# Patient Record
Sex: Male | Born: 1980 | Race: White | Hispanic: No | Marital: Single | Smoking: Never smoker
Health system: Southern US, Community
[De-identification: ages and names within clinical notes are randomized; demographics above are authoritative.]

## PROBLEM LIST (undated history)

## (undated) DIAGNOSIS — G8929 Other chronic pain: Secondary | ICD-10-CM

## (undated) DIAGNOSIS — R569 Unspecified convulsions: Secondary | ICD-10-CM

## (undated) DIAGNOSIS — M549 Dorsalgia, unspecified: Secondary | ICD-10-CM

---
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
## 2002-05-21 ENCOUNTER — Encounter: Payer: Self-pay | Admitting: Emergency Medicine

## 2002-05-21 ENCOUNTER — Emergency Department (HOSPITAL_COMMUNITY): Admission: EM | Admit: 2002-05-21 | Discharge: 2002-05-21 | Payer: Self-pay | Admitting: Emergency Medicine

## 2004-04-30 ENCOUNTER — Other Ambulatory Visit: Payer: Self-pay

## 2004-05-25 ENCOUNTER — Other Ambulatory Visit: Payer: Self-pay

## 2006-03-30 ENCOUNTER — Emergency Department: Payer: Self-pay | Admitting: Emergency Medicine

## 2007-01-16 ENCOUNTER — Emergency Department: Payer: Self-pay | Admitting: Unknown Physician Specialty

## 2007-07-05 ENCOUNTER — Other Ambulatory Visit: Payer: Self-pay

## 2007-07-05 ENCOUNTER — Emergency Department: Payer: Self-pay | Admitting: Emergency Medicine

## 2008-02-16 ENCOUNTER — Emergency Department: Payer: Self-pay | Admitting: Emergency Medicine

## 2008-06-29 ENCOUNTER — Emergency Department (HOSPITAL_COMMUNITY): Admission: EM | Admit: 2008-06-29 | Discharge: 2008-06-29 | Payer: Self-pay | Admitting: Emergency Medicine

## 2008-11-02 ENCOUNTER — Emergency Department: Payer: Self-pay | Admitting: Internal Medicine

## 2008-11-05 ENCOUNTER — Inpatient Hospital Stay (HOSPITAL_COMMUNITY): Admission: EM | Admit: 2008-11-05 | Discharge: 2008-11-10 | Payer: Self-pay | Admitting: Emergency Medicine

## 2008-11-20 ENCOUNTER — Emergency Department (HOSPITAL_COMMUNITY): Admission: EM | Admit: 2008-11-20 | Discharge: 2008-11-20 | Payer: Self-pay | Admitting: Emergency Medicine

## 2008-11-30 ENCOUNTER — Emergency Department: Payer: Self-pay | Admitting: Internal Medicine

## 2009-01-04 ENCOUNTER — Emergency Department: Payer: Self-pay | Admitting: Emergency Medicine

## 2009-01-08 ENCOUNTER — Inpatient Hospital Stay: Payer: Self-pay | Admitting: Internal Medicine

## 2009-02-16 IMAGING — CT CT HEAD WITHOUT CONTRAST
2 series · 16 of 30 positions shown, 20 images · non-contrast
Comparison: none

REASON FOR EXAM: post fall
COMMENTS:

[Series 2: without · axial · non-contrast · 0.41mm/px · z∈[-80,+56]mm · 13 of 33 slices shown, 17 images]
[im 3/33  brain]
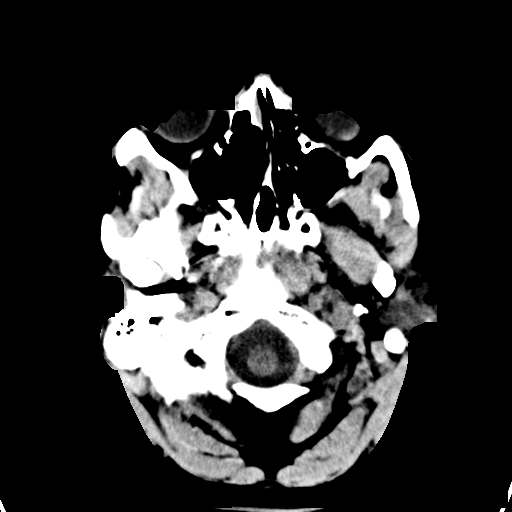
[im 3/33  bone]
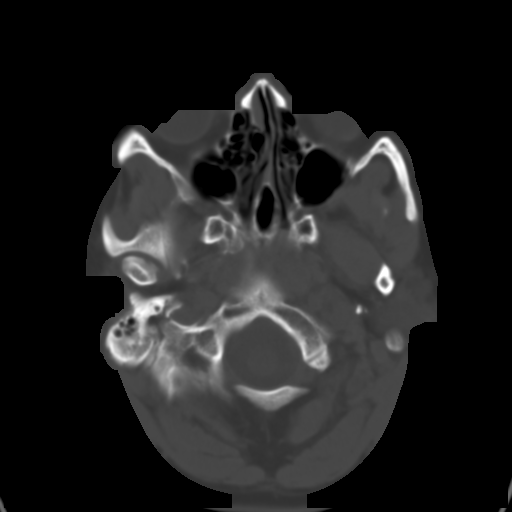
[im 5/33  brain]
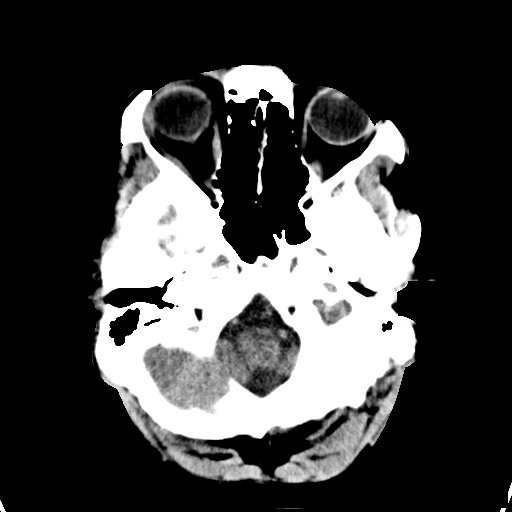
[im 7/33  brain]
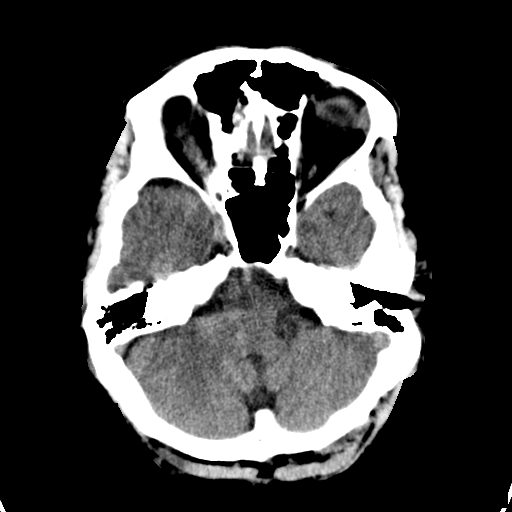
[im 10/33  brain]
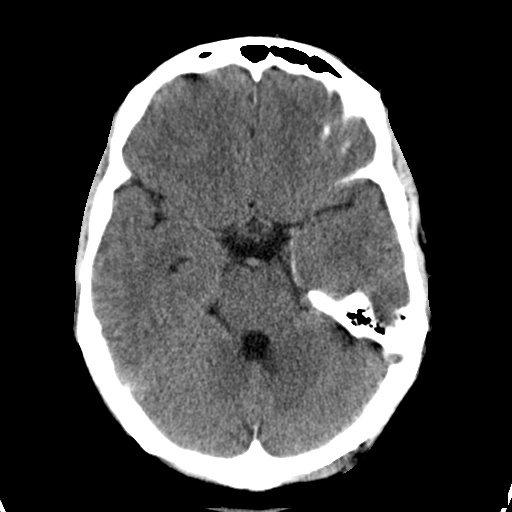
[im 12/33  brain]
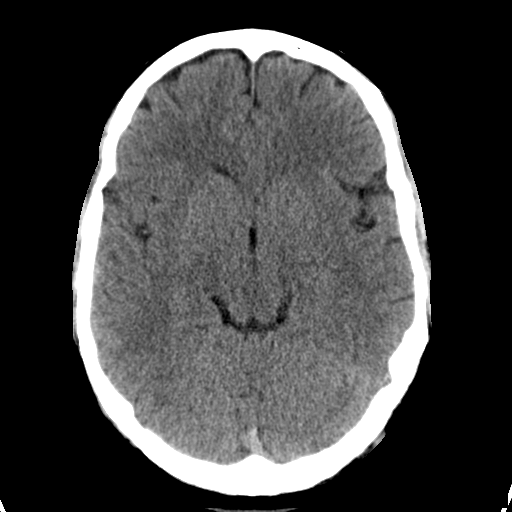
[im 12/33  bone]
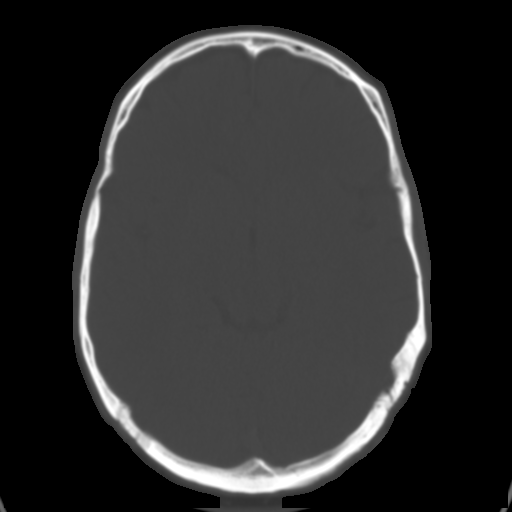
[im 14/33  brain]
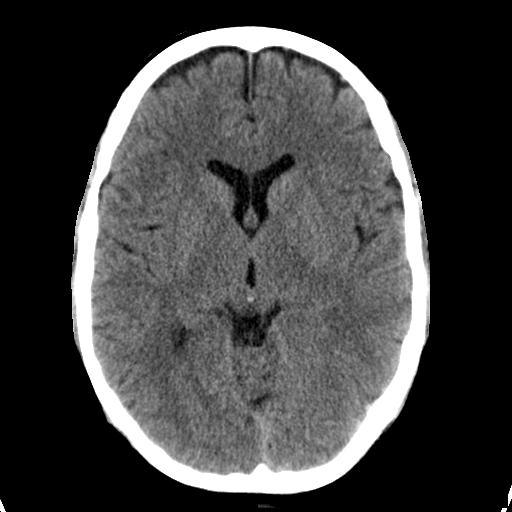
[im 17/33  brain]
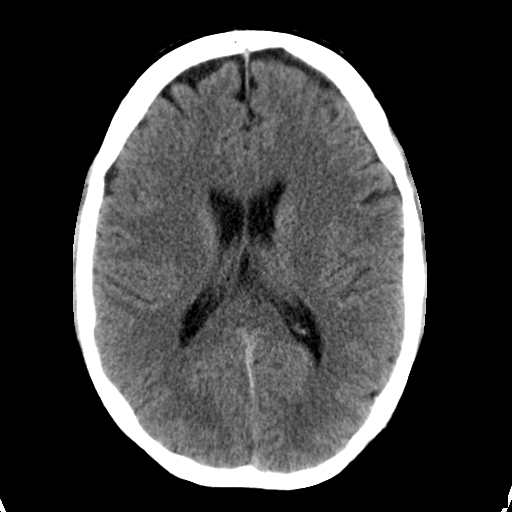
[im 19/33  brain]
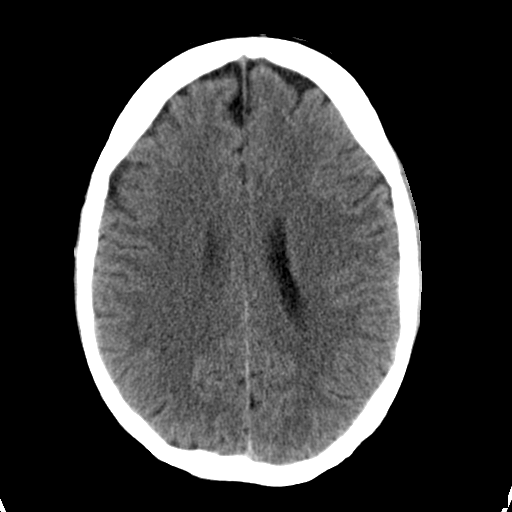
[im 21/33  brain]
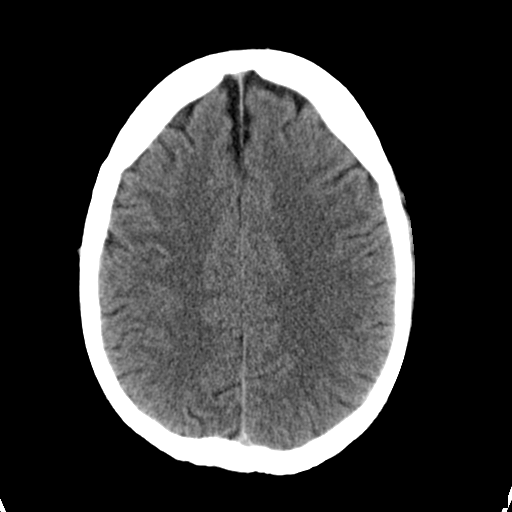
[im 21/33  bone]
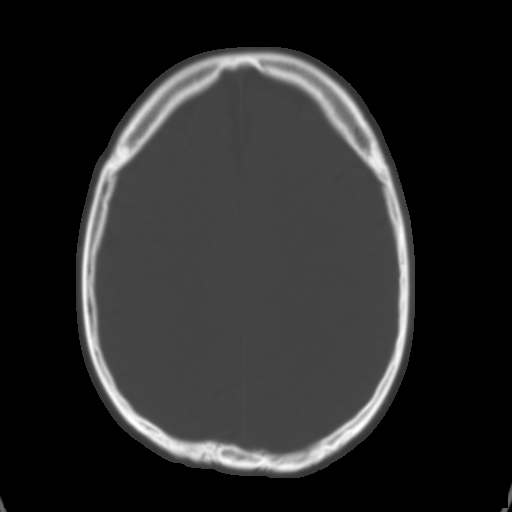
[im 23/33  brain]
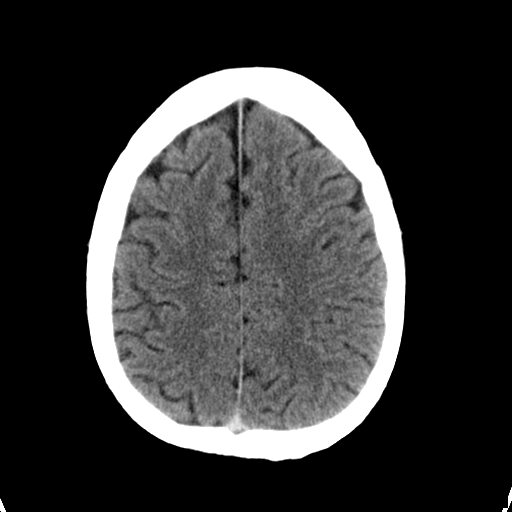
[im 26/33  brain]
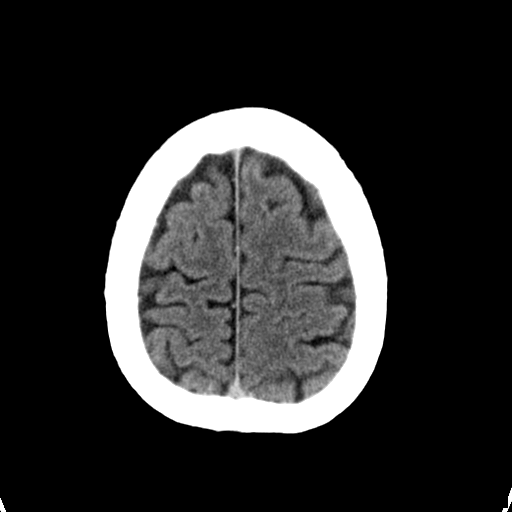
[im 28/33  brain]
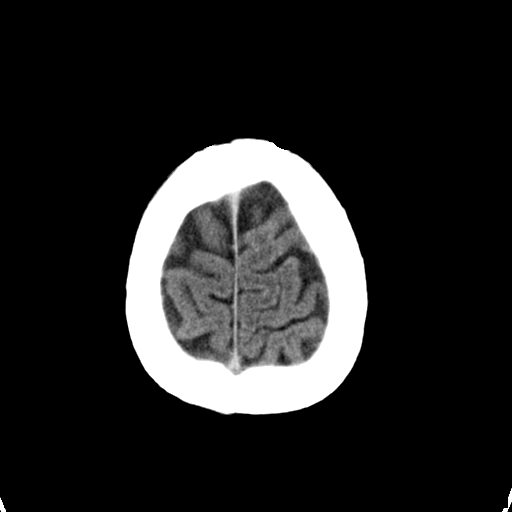
[im 30/33  brain]
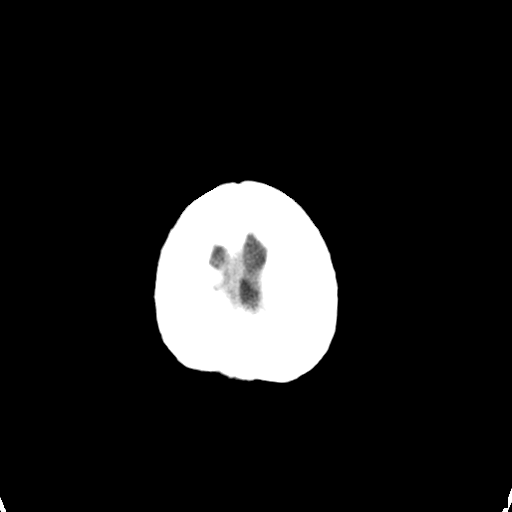
[im 30/33  bone]
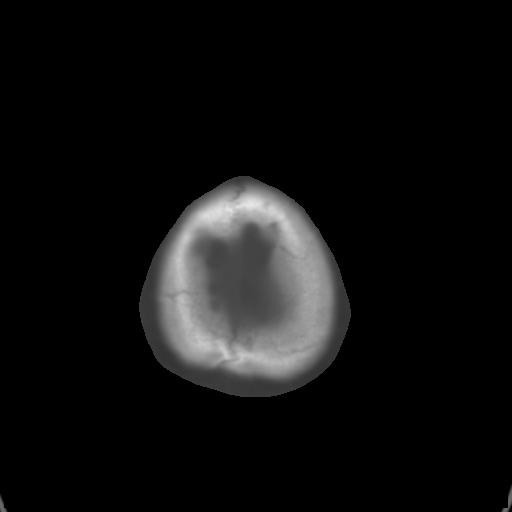

[Series 3: bone · axial · 0.41mm/px · z∈[-80,-34]mm · 3 of 33 slices shown]
[im 3/33  bone]
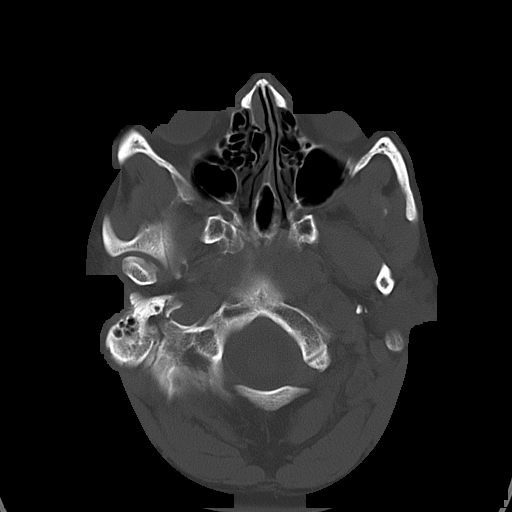
[im 7/33  bone]
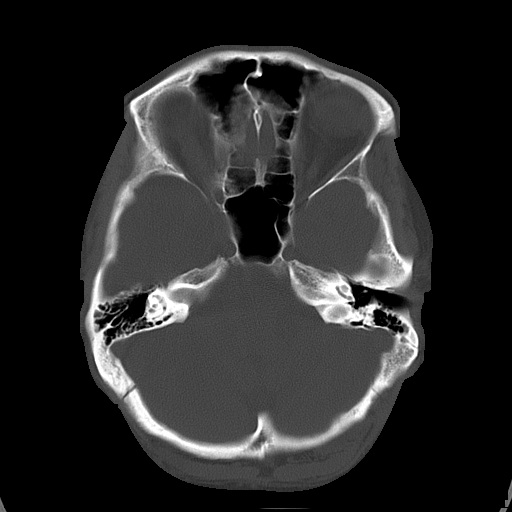
[im 12/33  bone]
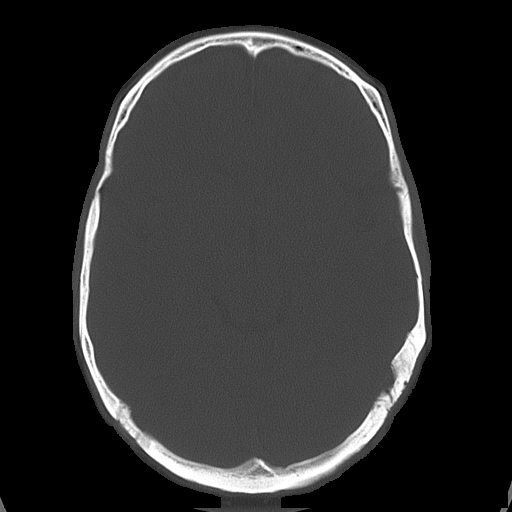

[16 of 30 positions shown; findings below may reference images not displayed]

PROCEDURE:     CT  - CT HEAD WITHOUT CONTRAST  - January 09, 2009  [DATE]

RESULT:     Non-contrast CT of the brain is compared to previous
examinations dated 01/07/2009 and 11/02/2008.

The ventricles and sulci are within normal limits for the patient's age.
There is no territorial infarct. There is no parenchymal or extra-axial
hemorrhage. There is no mass-effect or midline shift. There is no underlying
mass evident. The included images through the paranasal sinuses and mastoid
air cells demonstrate normal aeration. The calvarium is intact.
IMPRESSION: 1.     Stable CT of the brain with age appropriate atrophy. No acute
intracranial abnormality evident.
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
## 2011-05-13 NOTE — Consult Note (Signed)
NAME:  Ian Bowers, Ian Bowers            ACCOUNT NO.:  [id number]   MEDICAL RECORD NO.:  [id number]          PATIENT TYPE:  EMS   LOCATION:  MAJO                         FACILITY:  [facility]   PHYSICIAN:  Felicie Morn, PA-C      DATE OF BIRTH:  1981/07/11   DATE OF CONSULTATION:  11/20/2008  DATE OF DISCHARGE:                                 CONSULTATION   ADDENDUM   RECOMMENDATION:  Dilantin 300 mg q.h.s.   The patient was given a script by Dr. Anne Hahn for Dilantin 100 mg, take 3  tabs q.h.s., dispense #120.           ______________________________  Felicie Morn, PA-C     DS/MEDQ  D:  11/20/2008  T:  11/20/2008  Job:  [id number]
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
## 2011-05-13 NOTE — Discharge Summary (Signed)
Ian Bowers, Ian Bowers            ACCOUNT NO.:  [id number]   MEDICAL RECORD NO.:  [id number]          PATIENT TYPE:  INP   LOCATION:  3015                         FACILITY:  [facility]   PHYSICIAN:  Altha Harm, MDDATE OF BIRTH:  [birth year]   DATE OF ADMISSION:  11/05/2008  DATE OF DISCHARGE:  11/10/2008                               DISCHARGE SUMMARY   DISCHARGE DISPOSITION:  Home.   DISCHARGE DIAGNOSES:  1. Seizures versus pseudoseizures.  2. Pain in lower extremities, etiology unknown.  3. Gait disturbance.  4. Rule out somatoform disorder.   DISCHARGE MEDICATIONS:  1. Keppra 1500 mg p.o. b.i.d.  2. Oxycodone 5 mg p.o. q.4 h p.r.n.   CONSULTANTS:  1. Catherine A. Orlin Hilding, MD, neurology.  2. Antonietta Breach, MD, psychiatry.   PROCEDURES:  None.   DIAGNOSTIC STUDIES:  1. CT head without contrast done at admission on November 8 which is      negative noncontrast head CT.  2. Portable chest x-ray done at admission on November 8 which shows an      unremarkable examination.  3. CT of the cervical spine without contrast which shows no focal or      acute osseous lesions.  Spinal cord appears patent throughout.      Dependent atelectasis in the lungs bilaterally.  4. CT of the thoracic spine without contrast which shows no focal or      acute osseous lesions.  Spinal canal appears patent throughout.  5. X-ray of the lumbar spine which shows mild straightening of the      lumbar lordosis otherwise normal examination.  6. MR of the brain with and without contrast which shows no      significant abnormality.  7. MR of the C-spine with and without contrast which shows no      significant abnormality.  8. MR of the T-spine with and without contrast which shows no      significant abnormality.  9. EEG done on November 9 which shows an equivocal study.   ALLERGIES:  No known drug allergies.   CODE STATUS:  Full code.   PRIMARY CARE PHYSICIAN:  Unassigned at this time.   CHIEF COMPLAINT:  Recurrent seizures and blackouts.   HISTORY OF PRESENT ILLNESS:  Please refer to the H and P by Dr. Brien Few for  details of the HPI.   HOSPITAL COURSE:  1. Seizure activity.  The patient reported seizures prior to coming to      the hospital.  The patient had several episodes of seizure like      activity.  However, on most occasions this was unobserved by any      hospital staff.  It was observed, however, on two occasions by the      nursing staff.  Neurology was consulted and EEG was done which was      equivocal and they are unsure as to whether or not this is true      seizure activity versus pseudoseizure activity.  The patient had      his last seizure here in the hospital approximately 48 hours ago  and the Keppra was increased from 1000 to 1500 mg.  The patient has      been advised by neurology to follow up as an outpatient and if the      seizures persist he will likely need 24 hour video EEG for further      evaluation.  2. Gait disturbance and inability to ambulate.  The patient describes      an usual sensation of pain which radiates from his legs all the way      up to his brain when he has any weightbearing.  The patient was      evaluated by neurology who felt that this pattern of pain did not      follow any known disease entity and they were unable to find any      physical findings to correlate with this symptomatology.  Physical      therapy has attempted to work with the patient and the patient had      been unable to bear weight throughout his legs.  ESR was done to      evaluate for any information and was found to be normal at 8.   The patient is being discharged home.  Currently he is using a  wheelchair and home care physical therapy and occupational therapy have  been ordered for the patient.  The patient has been taking oxycodone for  pain and will continue on oxycodone as an outpatient and follow up with  his primary care physician for  further pain management.  The patient has  been advised to follow up as an outpatient with [facility], where further testing if warranted will be done as an  outpatient.  Otherwise, the patient has no other medical problems and  has been stable from other perspectives.  The patient did have some  vomiting on arrival to the hospital, however, that has resolved and the  patient has been tolerating diet without any difficulty.   FOLLOWUP:  The patient is to follow up with primary care physician of  his choice.  The patient was formally being seen by Dr. Lacie Scotts at  [facility].  However, he states that he has chosen to  leave the practice and find a new physician.   DIETARY RESTRICTIONS:  Are none.   PHYSICAL RESTRICTIONS:  Activity as tolerated and directed by physical  therapy.      Altha Harm, MD  Electronically Signed     MAM/MEDQ  D:  11/10/2008  T:  11/10/2008  Job:  [id number]
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
## 2011-08-11 ENCOUNTER — Emergency Department: Payer: Self-pay | Admitting: Unknown Physician Specialty

## 2011-09-25 LAB — POCT I-STAT, CHEM 8
BUN: 16
Calcium, Ion: 1.07 — ABNORMAL LOW
Creatinine, Ser: 1
Glucose, Bld: 119 — ABNORMAL HIGH
Hemoglobin: 17.3 — ABNORMAL HIGH

## 2011-09-25 LAB — URINALYSIS, ROUTINE W REFLEX MICROSCOPIC
Bilirubin Urine: NEGATIVE
Glucose, UA: NEGATIVE
Hgb urine dipstick: NEGATIVE
Ketones, ur: 15 — AB
Nitrite: NEGATIVE
Protein, ur: NEGATIVE
Specific Gravity, Urine: 1.007

## 2011-09-25 LAB — CBC
HCT: 46.9
Hemoglobin: 16.1
MCHC: 34.3
MCV: 81.8
Platelets: 328
RBC: 5.74
RDW: 12.6
WBC: 8.9

## 2011-09-25 LAB — DIFFERENTIAL
Basophils Absolute: 0.1
Basophils Relative: 1
Eosinophils Relative: 1
Neutrophils Relative %: 63

## 2011-09-25 LAB — HEPATIC FUNCTION PANEL
Albumin: 5.1
Bilirubin, Direct: 0.2

## 2011-09-25 LAB — LIPASE, BLOOD: Lipase: 19

## 2011-09-30 LAB — POCT I-STAT, CHEM 8
Chloride: 102
Chloride: 103
HCT: 45
Hemoglobin: 15.3
Hemoglobin: 17.7 — ABNORMAL HIGH
Potassium: 3.9
Potassium: 3.9
Sodium: 140
TCO2: 24

## 2011-09-30 LAB — BASIC METABOLIC PANEL
BUN: 10
BUN: 6
CO2: 27
CO2: 31
Calcium: 8.7
Calcium: 9.2
Calcium: 9.7
Chloride: 103
Creatinine, Ser: 0.82
GFR calc Af Amer: >60
GFR calc Af Amer: >60
GFR calc Af Amer: >60
GFR calc non Af Amer: >60
GFR calc non Af Amer: >60
GFR calc non Af Amer: >60
GFR calc non Af Amer: >60
Glucose, Bld: 101 — ABNORMAL HIGH
Glucose, Bld: 88
Potassium: 3.2 — ABNORMAL LOW
Potassium: 3.8
Potassium: 4
Sodium: 137
Sodium: 139
Sodium: 140

## 2011-09-30 LAB — CBC
HCT: 44.2
Hemoglobin: 13
MCV: 82.2
Platelets: 299
RBC: 4.72
RDW: 12.6
RDW: 12.9
WBC: 6.8
WBC: 9.5

## 2011-09-30 LAB — DIFFERENTIAL
Basophils Absolute: 0.1
Eosinophils Relative: 2
Lymphocytes Relative: 36
Lymphs Abs: 2.5
Neutro Abs: 3.7
Neutrophils Relative %: 54

## 2011-09-30 LAB — PROLACTIN: Prolactin: 11.8 (ref 2.1–17.1)

## 2011-09-30 LAB — PROTIME-INR
INR: 1
Prothrombin Time: 13.4

## 2011-09-30 LAB — RPR: RPR Ser Ql: NONREACTIVE

## 2011-09-30 LAB — CK: Total CK: 87

## 2011-09-30 LAB — RAPID URINE DRUG SCREEN, HOSP PERFORMED
Benzodiazepines: POSITIVE — AB
Cocaine: NOT DETECTED
Opiates: NOT DETECTED

## 2011-09-30 LAB — HEMOGLOBIN A1C
Hgb A1c MFr Bld: 5.4
Mean Plasma Glucose: 108

## 2011-09-30 LAB — PHOSPHORUS: Phosphorus: 5 — ABNORMAL HIGH

## 2012-06-25 ENCOUNTER — Emergency Department: Payer: Self-pay | Admitting: Emergency Medicine

## 2012-08-26 ENCOUNTER — Emergency Department: Payer: Self-pay | Admitting: *Deleted

## 2012-08-26 LAB — URINALYSIS, COMPLETE
Bacteria: NONE SEEN
Hyaline Cast: 2
Nitrite: NEGATIVE
Ph: 5 (ref 4.5–8.0)
Protein: 100

## 2012-08-26 LAB — CBC WITH DIFFERENTIAL/PLATELET
Basophil %: 0.8 %
Eosinophil #: 0.2 10*3/uL (ref 0.0–0.7)
Eosinophil %: 1.5 %
HCT: 44.8 % (ref 40.0–52.0)
HGB: 14.8 g/dL (ref 13.0–18.0)
Lymphocyte #: 1.4 10*3/uL (ref 1.0–3.6)
MCH: 26.9 pg (ref 26.0–34.0)
MCHC: 33 g/dL (ref 32.0–36.0)
Monocyte #: 0.7 x10 3/mm (ref 0.2–1.0)
Neutrophil %: 85 %
Platelet: 304 10*3/uL (ref 150–440)

## 2012-08-26 LAB — BASIC METABOLIC PANEL
Creatinine: 1.15 mg/dL (ref 0.60–1.30)
EGFR (African American): >60
EGFR (Non-African Amer.): >60
Glucose: 141 mg/dL — ABNORMAL HIGH (ref 65–99)
Sodium: 138 mmol/L (ref 136–145)

## 2012-12-16 ENCOUNTER — Emergency Department: Payer: Self-pay | Admitting: Emergency Medicine

## 2012-12-16 LAB — CBC
MCH: 26.7 pg (ref 26.0–34.0)
MCHC: 32.7 g/dL (ref 32.0–36.0)
MCV: 82 fL (ref 80–100)
Platelet: 278 10*3/uL (ref 150–440)

## 2012-12-16 LAB — TROPONIN I: Troponin-I: <0.02 ng/mL

## 2012-12-16 LAB — COMPREHENSIVE METABOLIC PANEL
Albumin: 4.3 g/dL (ref 3.4–5.0)
Anion Gap: 8 (ref 7–16)
BUN: 9 mg/dL (ref 7–18)
Calcium, Total: 8.4 mg/dL — ABNORMAL LOW (ref 8.5–10.1)
EGFR (African American): >60
Glucose: 75 mg/dL (ref 65–99)
Potassium: 3.1 mmol/L — ABNORMAL LOW (ref 3.5–5.1)
SGOT(AST): 24 U/L (ref 15–37)
SGPT (ALT): 28 U/L (ref 12–78)
Total Protein: 7.9 g/dL (ref 6.4–8.2)

## 2013-01-31 ENCOUNTER — Inpatient Hospital Stay: Payer: Self-pay | Admitting: Internal Medicine

## 2013-01-31 LAB — HEPATIC FUNCTION PANEL A (ARMC)
Alkaline Phosphatase: 92 U/L (ref 50–136)
SGOT(AST): 31 U/L (ref 15–37)
SGPT (ALT): 57 U/L (ref 12–78)

## 2013-01-31 LAB — DRUG SCREEN, URINE
Barbiturates, Ur Screen: NEGATIVE (ref ?–200)
Benzodiazepine, Ur Scrn: NEGATIVE (ref ?–200)
Cannabinoid 50 Ng, Ur ~~LOC~~: POSITIVE (ref ?–50)
Cocaine Metabolite,Ur ~~LOC~~: NEGATIVE (ref ?–300)
MDMA (Ecstasy)Ur Screen: NEGATIVE (ref ?–500)
Methadone, Ur Screen: NEGATIVE (ref ?–300)
Opiate, Ur Screen: POSITIVE (ref ?–300)
Phencyclidine (PCP) Ur S: NEGATIVE (ref ?–25)

## 2013-01-31 LAB — URINALYSIS, COMPLETE
Bilirubin,UR: NEGATIVE
Granular Cast: 12
Nitrite: NEGATIVE
Protein: NEGATIVE
WBC UR: 3 /HPF (ref 0–5)

## 2013-01-31 LAB — BASIC METABOLIC PANEL
Anion Gap: 11 (ref 7–16)
Calcium, Total: 9.8 mg/dL (ref 8.5–10.1)
Co2: 24 mmol/L (ref 21–32)
Creatinine: 1.06 mg/dL (ref 0.60–1.30)
EGFR (African American): >60
Glucose: 95 mg/dL (ref 65–99)
Potassium: 3.5 mmol/L (ref 3.5–5.1)

## 2013-01-31 LAB — CBC WITH DIFFERENTIAL/PLATELET
Eosinophil #: 0.2 10*3/uL (ref 0.0–0.7)
Eosinophil %: 1.4 %
HCT: 46.1 % (ref 40.0–52.0)
Neutrophil %: 64.2 %
RBC: 5.61 10*6/uL (ref 4.40–5.90)
WBC: 14.1 10*3/uL — ABNORMAL HIGH (ref 3.8–10.6)

## 2013-01-31 LAB — ETHANOL
Ethanol %: <0.003 % (ref 0.000–0.080)
Ethanol: <3 mg/dL

## 2013-01-31 LAB — PHENYTOIN LEVEL, TOTAL: Dilantin: 0.7 ug/mL — ABNORMAL LOW (ref 10.0–20.0)

## 2013-02-01 ENCOUNTER — Ambulatory Visit: Payer: Self-pay | Admitting: Neurology

## 2013-02-01 LAB — CBC WITH DIFFERENTIAL/PLATELET
HCT: 41.4 % (ref 40.0–52.0)
HGB: 14 g/dL (ref 13.0–18.0)
Lymphocyte #: 3.6 10*3/uL (ref 1.0–3.6)
Lymphocyte %: 33.9 %
MCH: 27.7 pg (ref 26.0–34.0)
MCV: 82 fL (ref 80–100)
Neutrophil %: 56.2 %
Platelet: 281 10*3/uL (ref 150–440)
RBC: 5.05 10*6/uL (ref 4.40–5.90)

## 2013-02-04 ENCOUNTER — Emergency Department: Payer: Self-pay | Admitting: Emergency Medicine

## 2013-02-04 LAB — URINALYSIS, COMPLETE
Bacteria: NONE SEEN
Blood: NEGATIVE
Glucose,UR: NEGATIVE mg/dL (ref 0–75)
Ph: 6 (ref 4.5–8.0)
Specific Gravity: 1.031 (ref 1.003–1.030)
WBC UR: 4 /HPF (ref 0–5)

## 2013-02-04 LAB — COMPREHENSIVE METABOLIC PANEL
Alkaline Phosphatase: 87 U/L (ref 50–136)
Anion Gap: 20 — ABNORMAL HIGH (ref 7–16)
Calcium, Total: 9.6 mg/dL (ref 8.5–10.1)
Co2: 15 mmol/L — ABNORMAL LOW (ref 21–32)
Creatinine: 1.24 mg/dL (ref 0.60–1.30)
EGFR (Non-African Amer.): >60
Osmolality: 270 (ref 275–301)
Potassium: 3.3 mmol/L — ABNORMAL LOW (ref 3.5–5.1)
Sodium: 133 mmol/L — ABNORMAL LOW (ref 136–145)
Total Protein: 8.9 g/dL — ABNORMAL HIGH (ref 6.4–8.2)

## 2013-02-04 LAB — VALPROIC ACID LEVEL: Valproic Acid: 41 ug/mL — ABNORMAL LOW

## 2013-02-04 LAB — CBC
HGB: 15.5 g/dL (ref 13.0–18.0)
MCHC: 32.8 g/dL (ref 32.0–36.0)
RDW: 12.8 % (ref 11.5–14.5)
WBC: 14.2 10*3/uL — ABNORMAL HIGH (ref 3.8–10.6)

## 2013-02-07 ENCOUNTER — Emergency Department: Payer: Self-pay | Admitting: Emergency Medicine

## 2013-03-10 IMAGING — CT CT HEAD WITHOUT CONTRAST
1 series · 16 of 30 positions shown, 20 images · non-contrast
Comparison: none

REASON FOR EXAM: recurrent seizures
COMMENTS:

PROCEDURE:     CT  - CT HEAD WITHOUT CONTRAST  - January 31, 2013  [DATE]
RESULT:     Comparison:  06/25/2012
TECHNIQUE: Multiple axial images from the foramen magnum to the vertex were
obtained without IV contrast.

[Series 2: soft tissue · axial · 0.43mm/px · z∈[+230,+375]mm · 16 of 33 slices shown, 20 images]
[im 2/33  brain]
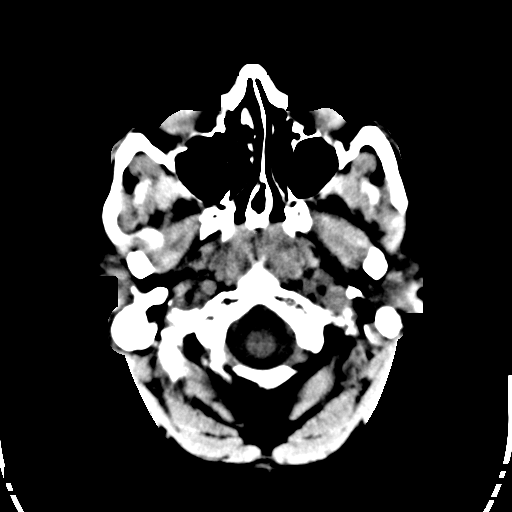
[im 2/33  bone]
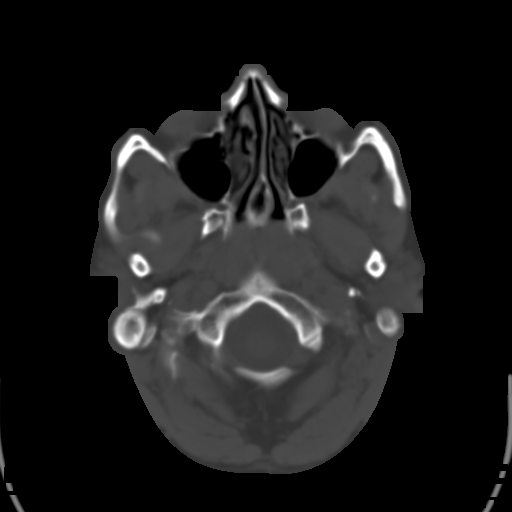
[im 4/33  brain]
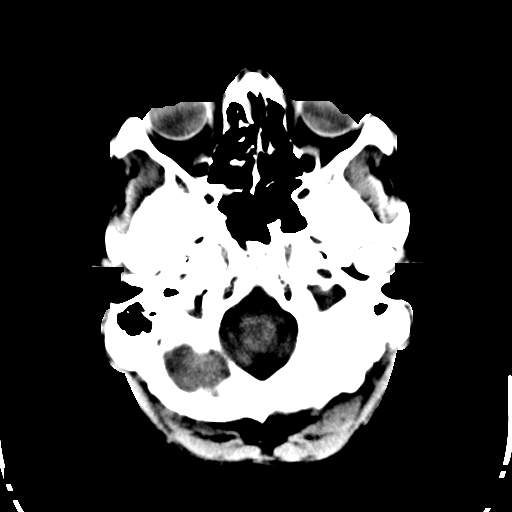
[im 6/33  brain]
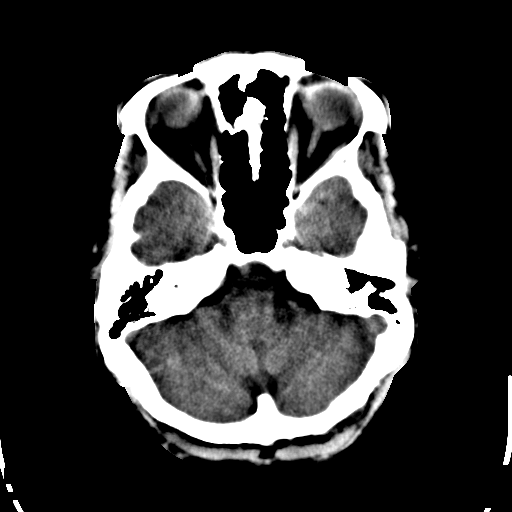
[im 8/33  brain]
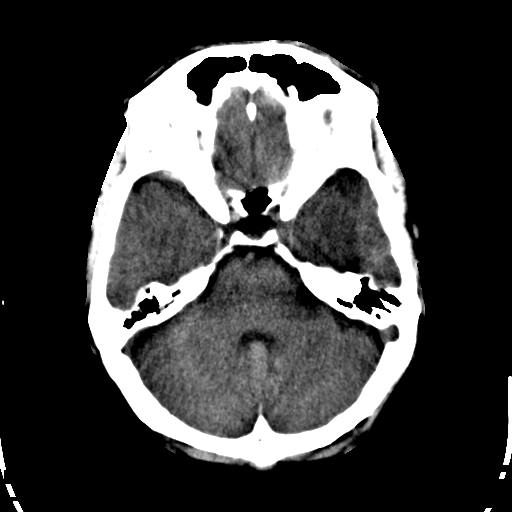
[im 9/33  brain]
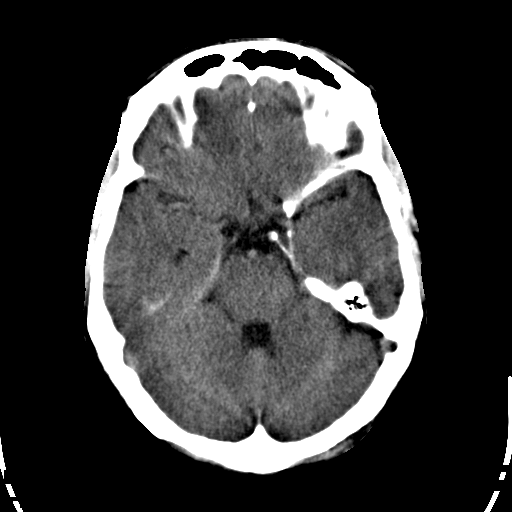
[im 9/33  bone]
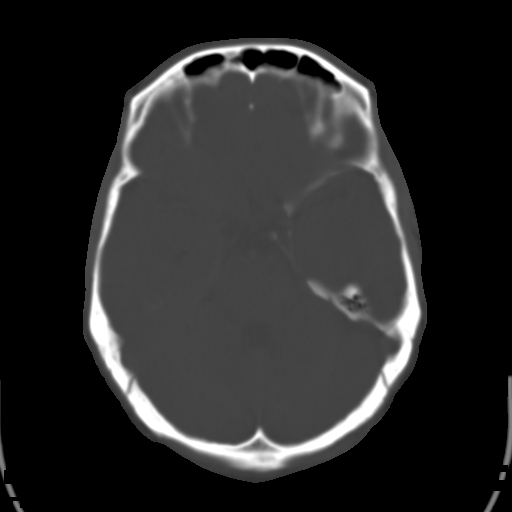
[im 12/33  brain]
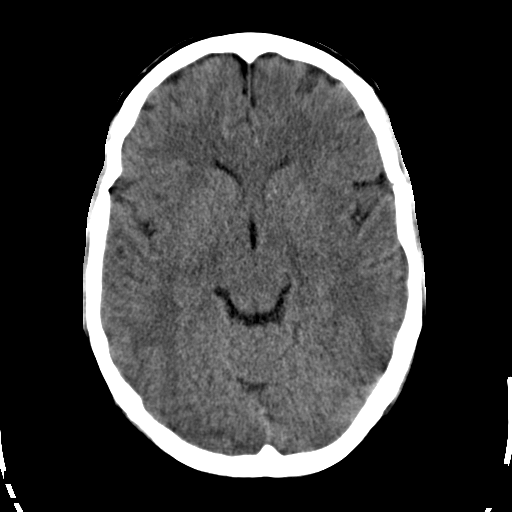
[im 14/33  brain]
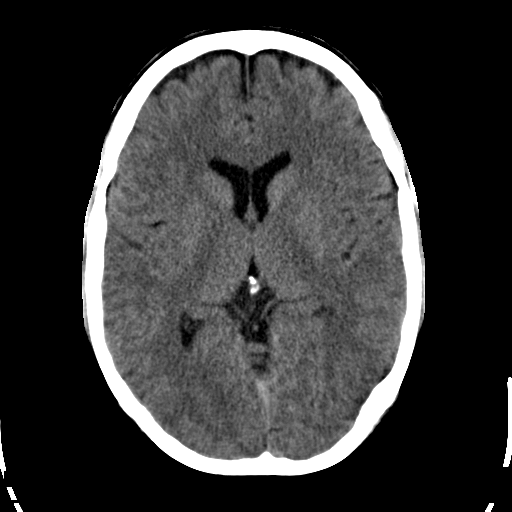
[im 16/33  brain]
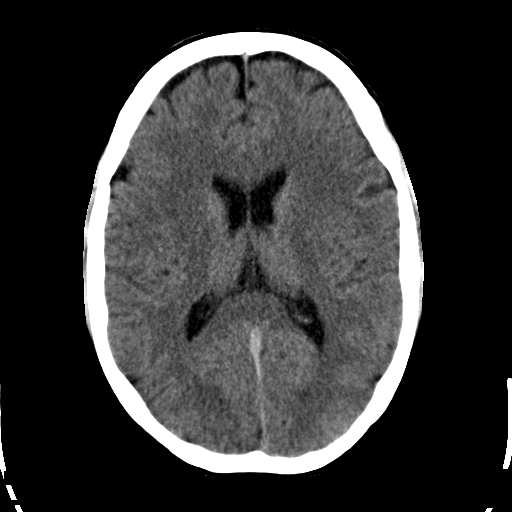
[im 17/33  brain]
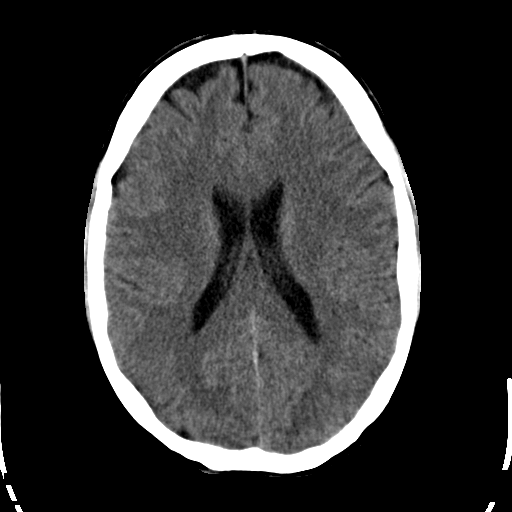
[im 17/33  bone]
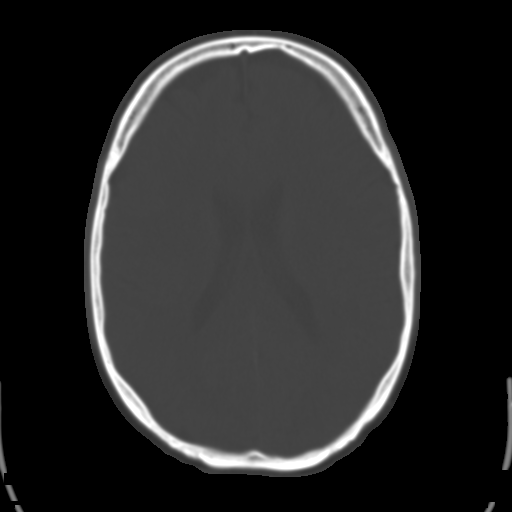
[im 19/33  brain]
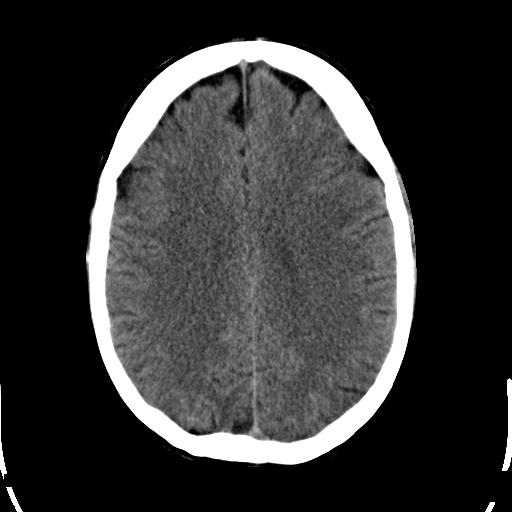
[im 21/33  brain]
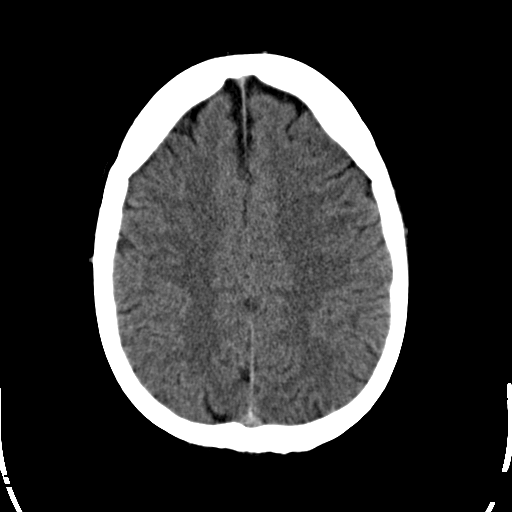
[im 24/33  brain]
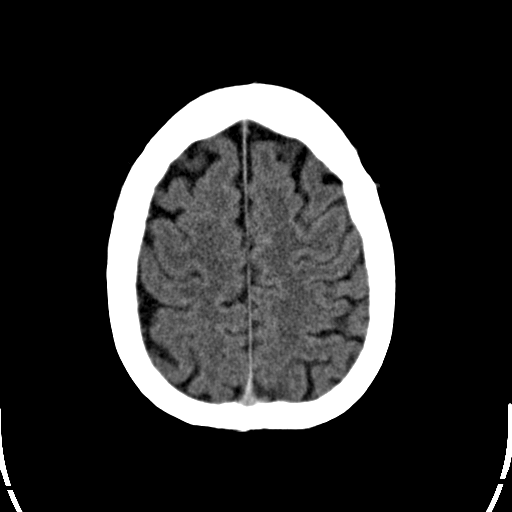
[im 25/33  brain]
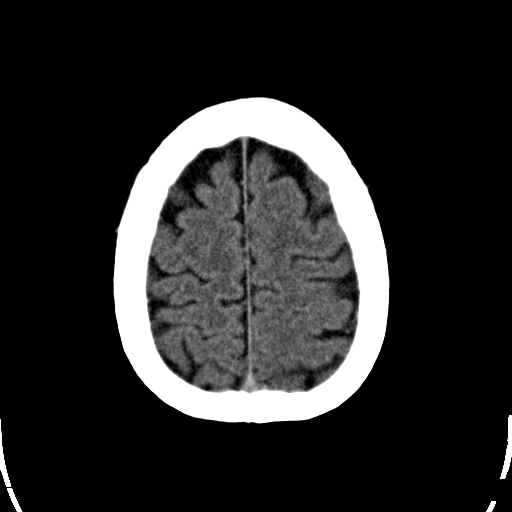
[im 25/33  bone]
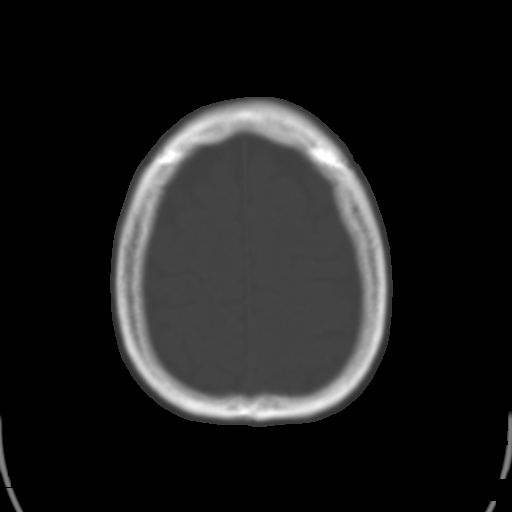
[im 27/33  brain]
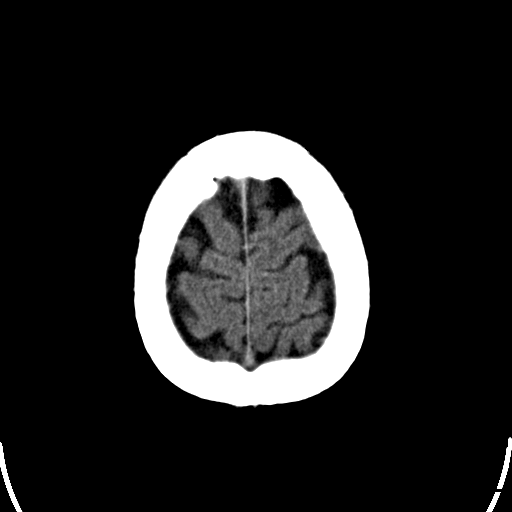
[im 29/33  brain]
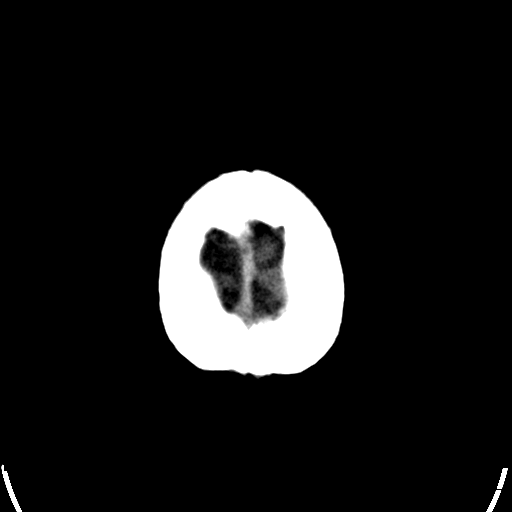
[im 31/33  brain]
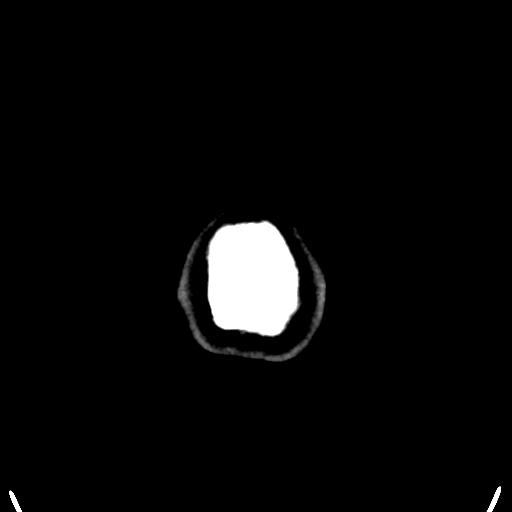

[16 of 30 positions shown; findings below may reference images not displayed]

FINDINGS: There is no evidence for mass effect, midline shift, or extra-axial fluid
collections. There is no evidence for space-occupying lesion, intracranial
hemorrhage, or cortical-based area of infarction.

The osseous structures are unremarkable.
IMPRESSION: No acute intracranial process.

[REDACTED]

## 2013-08-04 ENCOUNTER — Emergency Department: Payer: Self-pay | Admitting: Emergency Medicine

## 2014-12-27 ENCOUNTER — Emergency Department: Payer: Self-pay | Admitting: Emergency Medicine

## 2014-12-27 LAB — COMPREHENSIVE METABOLIC PANEL
ALBUMIN: 4.3 g/dL (ref 3.4–5.0)
ALK PHOS: 81 U/L
ALT: 76 U/L — AB
ANION GAP: 6 — AB (ref 7–16)
AST: 43 U/L — AB (ref 15–37)
BUN: 11 mg/dL (ref 7–18)
Bilirubin,Total: 0.6 mg/dL (ref 0.2–1.0)
CO2: 30 mmol/L (ref 21–32)
CREATININE: 0.8 mg/dL (ref 0.60–1.30)
Calcium, Total: 9.2 mg/dL (ref 8.5–10.1)
Chloride: 100 mmol/L (ref 98–107)
EGFR (African American): >60
GLUCOSE: 91 mg/dL (ref 65–99)
OSMOLALITY: 271 (ref 275–301)
Potassium: 3.6 mmol/L (ref 3.5–5.1)
SODIUM: 136 mmol/L (ref 136–145)
Total Protein: 8.5 g/dL — ABNORMAL HIGH (ref 6.4–8.2)

## 2014-12-27 LAB — SALICYLATE LEVEL

## 2014-12-27 LAB — URINALYSIS, COMPLETE
BACTERIA: NONE SEEN
BILIRUBIN, UR: NEGATIVE
BLOOD: NEGATIVE
GLUCOSE, UR: NEGATIVE mg/dL (ref 0–75)
KETONE: NEGATIVE
Leukocyte Esterase: NEGATIVE
Nitrite: NEGATIVE
Ph: 6 (ref 4.5–8.0)
Protein: NEGATIVE
RBC, UR: NONE SEEN /HPF (ref 0–5)
SQUAMOUS EPITHELIAL: NONE SEEN
Specific Gravity: 1.003 (ref 1.003–1.030)

## 2014-12-27 LAB — CBC
HCT: 44 % (ref 40.0–52.0)
HGB: 14.4 g/dL (ref 13.0–18.0)
MCH: 27.1 pg (ref 26.0–34.0)
MCHC: 32.8 g/dL (ref 32.0–36.0)
MCV: 83 fL (ref 80–100)
PLATELETS: 305 10*3/uL (ref 150–440)
RBC: 5.34 10*6/uL (ref 4.40–5.90)
RDW: 14.2 % (ref 11.5–14.5)
WBC: 7.9 10*3/uL (ref 3.8–10.6)

## 2014-12-27 LAB — DRUG SCREEN, URINE

## 2014-12-27 LAB — ETHANOL: Ethanol: <3 mg/dL

## 2014-12-27 LAB — ACETAMINOPHEN LEVEL: Acetaminophen: <2 ug/mL

## 2015-04-20 NOTE — Consult Note (Signed)
PATIENT NAME:  Ian Bowers, Ian Bowers MR#:  [id number] DATE OF BIRTH:  07/25/1981  DATE OF CONSULTATION:  02/01/2013  REFERRING PHYSICIAN:   CONSULTING PHYSICIAN:  Pauletta Browns, MD  REASON FOR CONSULTATION: Seizures.  HISTORY OF PRESENT ILLNESS: This is a 34 year old male who as per him reports a history of seizures when the patient was a child and being admitted for multiple generalized tonic-clonic seizure episodes. The patient states his seizures were in childhood. I am unsure if he was treated for them and then they subsided and then they restarted at the age of 33. Has been worked up as an outpatient and the patient was diagnosed as pseudoseizures. He has been having multiple episodes of generalized tonic-clonic seizures for the past 5 or 6 years. For treatment the patient has been taking Dilantin as needed for his seizures. He states that he sometimes takes 800 mg to 1200 mg a day of Dilantin for them and he can go a week without taking it. As per the patient, he has been having 1 to 2 seizures per month and came in here today because of generalized tonic-clonic seizure. He states he was talking on the phone. The phone discussion included inability to obtain a bond for his release and the patient became anxious, angry and then the next thing he knew he was on the ground. When he was brought to the hospital, he states that he was very anxious and had another two seizure episodes. Seizure episodes are described as tonic-clonic seizure activity and the patient is suspected to be postictal. There was one episode of urinary incontinence. In the Emergency Department, he was loaded on Dilantin and started on 400 mg at bedtime. Currently the patient is back to his baseline. When asked how he feels about the time of his seizures, the patient states his seizures start when he gets angry and anxious and he cannot control it and then he states they go into full-blown seizure which is described as generalized  shaking. The patient feels like he loses consciousness. In the ER, he is status post EEG. Of note, the patient also has history of bipolar disorder, depression and anxiety and is followed by mental health clinic. Apparently he was there recently. He was given prescription medications which he could not fill.  PAST MEDICAL HISTORY: Other past medical history are seizures, as described above, diagnosed with pseudoseizure disorder, and hypertension which he takes clonidine for.  DRUG ALLERGIES: He states he believes he was allergic to Dilantin despite him receiving multiple doses of IV Dilantin.   FAMILY HISTORY: He states his father's side has history of cardiac and diabetic problems.   REVIEW OF SYSTEMS:  CONSTITUTIONAL: Denies fever, chills, fatigue. EYES: No blurred vision.  ENT: No hearing loss. No difficulty swallowing. RESPIRATORY: No shortness of breath.  CARDIOVASCULAR: No chest pain. GASTROENTEROLOGY: No nausea. No vomiting. No diarrhea.  GENITOURINARY: Denies dysuria, hematuria. SKIN: No rash.  NEUROLOGIC: Complains of anxiety and depression.  LABS/RADIOLOGIC STUDIES: Electrolytes were reviewed.   His CAT scan of the head was reviewed as well and did not show any acute abnormalities.   Status post routine EEG. Preliminary report did not show any seizure activity and will be read by Dr. Malvin Johns.  CURRENT PHYSICAL EXAMINATION: VITAL SIGNS: Temperature 98.8, pulse 97, respirations 18, blood pressure 150/83 and saturating well on room air.  NEUROLOGIC: The patient is alert, awake and oriented to time, place, location and date. He is able to tell me the President [place]  [place] as well and is able to count the days of the week backwards.  CRANIAL NERVES EXAMINATION: Extraocular movements are intact. Pupils are 4 mm to 2 mm, symmetrically. Facial sensation and facial motor is intact. Tongue is midline. Uvula elevates symmetrically. Shoulder shrug is equal bilaterally. MOTOR: No  rigidity. No spasticity. Upper extremity strength 5 out of 5, lower extremity 5 out of 5 bilaterally. Sensation intact to light touch and pinprick bilaterally. Reflexes 2+ throughout. COORDINATION: Finger-to-nose intact, heel-to-shin intact. Gait not assessed.   IMPRESSION AND RECOMMENDATIONS: This is a 34 year old male with questionable seizures during his childhood, has been seizure free until the age of 89 and has been having 1 to 2 seizure episodes per month and was diagnosed with pseudoseizures. He was given Dilantin p.r.n., which he took sublingually. He is status post 3 seizures, 1 prior to coming in and 2 in the Emergency Department. All of these patient's seizures are similar; the patient feels anxious, feeling like he is going to pass out, and at times he feels sad and then he states he has generalized shaking. Rarely he has urinary incontinence, like on this occasion. At this point, his symptoms are still consistent with pseudoseizures as you do not usually have this prodromal sense of anxiety and sadness prior to the episode. Preliminary EEG did not show any seizure activity and his CAT scan reviewed did not show any acute intracranial pathology. He was loaded with Dilantin and started on 400 mg at bedtime. At this point would consider stopping Dilantin and placing the patient on valproic acid because valproic acid would be good for mood stabilization which is used for bipolar depression as well as seizure control. Would recommend starting valproic acid 500 mg twice a day. I do not think the patient needs Dilantin p.r.n., as he takes, as he at times takes significant doses of 800 mg to 1200 mg a day which in itself can be pro-epileptogenic if very high levels of Dilantin will be in the system. The patient needs to follow with psychiatry. At this point, no further imaging is recommended. Thank you. It was a pleasure seeing this patient.  ____________________________ Pauletta BrownsYuriy Avrie Kedzierski,  MD yz:sb D: 02/01/2013 11:35:19 ET T: 02/01/2013 12:24:12 ET JOB#: [id number]  cc: Pauletta BrownsYuriy Cuauhtemoc Huegel, MD, <Dictator> Pauletta BrownsYURIY Kirah Stice MD ELECTRONICALLY SIGNED 02/06/2013 18:11

## 2015-04-20 NOTE — H&P (Signed)
PATIENT NAME:  Ian Bowers, ROBACK MR#:  [id number] DATE OF BIRTH:  June 15, 1981  DATE OF ADMISSION:  01/31/2013  PRIMARY CARE PHYSICIAN: None.  ED PHYSICIAN: Malachy Moan, MD  CHIEF COMPLAINT: Five seizures, tonic-clonic seizures.   HISTORY OF PRESENT ILLNESS: The patient is a 34 year old white male who reports that he was diagnosed with a seizure as a child and then he did not have any further seizures and then had recurrent seizures at age of 49 and then continued to have recurrent seizures for about 5 years and was told that he had pseudoseizures. He was on Dilantin that he was told to take it as needed. He reports that up until today he was having 1 to 2 seizures per month and then today he had to go into prison where he had an episode of tonic-clonic seizure while talking on the phone. The patient then subsequently had another witnessed seizure. He came to the ED and had 3 more episodes. The patient also had urinary incontinence in the ED.  He was given Dilantin. Currently he reports that he is feeling funny, feels like he is sitting in a boat. Otherwise denies any headaches, no neck stiffness. Denies any fevers, chills. He did have recent episode of cough and started taking some cough syrup today prior to going to prison.   PAST MEDICAL HISTORY: 1. Positive for seizure, unclear whether pseudoseizure or seizure disorder.  2. Bipolar disorder, depression and anxiety. Is followed at mental health. Currently not taking any medications.  3. He reports he has a problem with elevated blood pressure and is on clonidine at home.   PAST SURGICAL HISTORY: He had some sort of plate placed in his arm.   ALLERGIES: He reports that he is allergic to IV Dilantin but he is getting IV Dilantin right now. He reports that when he got IV Dilantin before his veins became hard as a rock, but he is tolerating Dilantin currently fine.   SOCIAL HISTORY: Denies smoking. He states that he uses marijuana. Denies any  other drug use.   FAMILY HISTORY: Denies any seizure disorder in the family.   REVIEW OF SYSTEMS:   CONSTITUTIONAL: Denies any fevers, fatigue or weakness. No pain. No weight loss or weight gain.  EYES: No blurred or double vision. No redness. No inflammation. No glaucoma.  ENT: No tinnitus. No ear pain. No hearing loss. No difficulty swallowing.  RESPIRATORY: Denies any wheezing or hemoptysis. Does complain of cough, but no COPD and no TB.  CARDIOVASCULAR: Denies any chest pain, orthopnea, edema or arrhythmia.  GASTROINTESTINAL: No nausea, vomiting or diarrhea. No abdominal pain. No hematemesis. No melena. No changes in bowel habits.  GENITOURINARY: Denies any dysuria, hematuria, renal cal or frequency.  ENDOCRINE: Denies any polyuria, nocturia or thyroid problems.  HEME/LYMPH: Denies anemia, easy bruisability or bleeding.  SKIN: No acne. No rash. No changes in mole, hair or skin.  MUSCULOSKELETAL: Denies any pain in the neck, back or shoulder.  NEUROLOGIC: Complains of history of seizure disorder. No CVA. No TIA.  PSYCHIATRIC: Has anxiety, bipolar disorder and depression.   PHYSICAL EXAMINATION: VITAL SIGNS: Temperature 98.6, pulse 103, respirations 20, blood pressure 178/85 and O2 100%.  GENERAL: The patient is a well-developed male currently not in any acute distress.  HEENT: Head atraumatic, normocephalic. Pupils equally round and reactive to light and accommodation. There is no conjunctival pallor. No scleral icterus. Nasal exam shows no drainage or ulceration. Oropharynx is clear without any exudates.  NECK: No thyromegaly.  No carotid bruits.  HEART: Regular rate and rhythm. No murmurs, rubs, clicks or gallops. PMI is not displaced.  LUNGS: Clear to auscultation without any accessory muscle usage.  ABDOMEN: Soft, nontender and nondistended. Positive bowel sounds x 4.  EXTREMITIES: No clubbing, cyanosis or edema.  SKIN: No rash.  LYMPHATICS: No lymph nodes palpable.  VASCULAR:  Good DP and PT pulses.  PSYCHIATRIC: Not anxious or depressed currently.  NEUROLOGICAL: Awake, alert and oriented x 3. No focal deficits.  LABS/RADIOLOGIC STUDIES: BMP: Glucose 95, BUN 11, creatinine 1.06, sodium 137, potassium 3.5, chloride 102 and CO2 24. Magnesium 1.7. LFTs: Total protein 8.4, albumin 4.8, bili total 0.3, alkaline phosphatase 92, AST 31 and ALT 57. Dilantin 0.7. TUDs are positive for THC and opioids. WBC 14.1, hemoglobin 14.9 and platelet count 341.   Chest x-ray shows mild hyperinflation.   ASSESSMENT AND PLAN: The patient is a 34 year old who has history of seizures, likely pseudoseizures, who presents with 5 tonic-clonic seizure-type activities.  1. Recurrent seizures, likely pseudoseizures in light of the circumstances of him having to go to prison. He did have Dilantin which was loaded. We will go ahead and continue p.o. Dilantin. Check Dilantin level in the a.m. The patient's case will be complex. We will ask neuro to come evaluate and get an EEG. That way it confirms the diagnosis.  2. Elevated WBC. We will CBC, likely reactive in nature.  3. Bipolar disorder. Not on any medications. If the patient starts exhibiting any abnormal psychiatric behavior, we will need psychiatry inpatient evaluation.  4. Hypertension. We will continue clonidine.  TIME SPENT: 35 minutes. ____________________________ Lacie ScottsShreyang H. Allena KatzPatel, MD shp:sb D: 01/31/2013 21:45:22 ET T: 02/01/2013 08:07:16 ET JOB#: [id number]  cc: Jalia Zuniga H. Allena KatzPatel, MD, <Dictator> Charise CarwinSHREYANG H Giordan Fordham MD ELECTRONICALLY SIGNED 02/05/2013 8:36

## 2015-04-20 NOTE — Discharge Summary (Signed)
PATIENT NAME:  Ian Bowers, Ian Bowers MR#:  [id number] DATE OF BIRTH:  July 14, 1981  DATE OF ADMISSION:  01/31/2013 DATE OF DISCHARGE:  02/03/2013  DISCHARGE DIAGNOSES:  1. Pseudo-nonepileptic seizures (PNES).  2. Bipolar disorder.  3. Possible malingering.   CONSULTS: Dr. Loretha BrasilZeylikman of neurology.   IMAGING STUDIES DONE: Include a CT head without contrast showed no acute intracranial process.   Chest x-ray, portable, showed no evidence of infiltrate.   PROCEDURES: EEG done twice showed no seizure activity or encephalopathy.   ADMITTING HISTORY AND PHYSICAL: Please see detailed H and P dictated previously. In brief, a 34 year old male patient with history of bipolar disorder and pseudoseizures. Was sent to prison after evading court order as per the patient. Started having seizures en route to the prison and was brought to the hospital Emergency Room. The patient was admitted to the hospitalist service for further workup and treatment.   HOSPITAL COURSE:  1. Pseudoseizures. The patient was seen by neurology, Dr. Loretha BrasilZeylikman, who suggested the patient possibly has pseudoseizures. The patient did not have any incontinence, tongue bite. No postictal state in spite of multiple seizures. He was using Dilantin only p.r.n. Depakote was started for control of seizures and also the bipolar disorder. Psychiatry saw the patient who suggested possible malingering. The patient will follow up as outpatient with his psychiatrist. The patient had 2 EEGs and had multiple episodes of seizures during the EEG on provocation and did not have any epileptiform waves on EEG.   The patient does seem to have possible malingering. He does not have any organic symptoms ruling out conversion disorder. This was discussed with Dr. Garnetta BuddyFaheem of psychiatry. The patient has [facility] the [facility] and [facility] as per the patient where they diagnosed him with pseudoseizures. The patient is being discharged back to the prison  with guarded prognosis. The patient will be seen by the prison physician.   DISCHARGE MEDICATIONS: Include:  1. Clonidine 0.1 mg oral 2 times a day.  2. Divalproex sodium 500 mg oral 2 times a day.  3. Benztropine 0.5 mg oral once a day.  4. Risperidone 0.5 mg oral once a day.   DISCHARGE INSTRUCTIONS: The patient will be discharged back to the prison. Further followup with the prison physician. Low-salt diet. Activity as tolerated. This plan was discussed with the patient who has verbalized understanding and is okay with the plan.   TIME SPENT: Time spent on day of discharge in discharge activity was 40 minutes.    ____________________________ Molinda BailiffSrikar R. Florabel Faulks, MD srs:gb D: 02/03/2013 14:50:01 ET T: 02/03/2013 22:59:37 ET JOB#: [id number]  cc: Wardell HeathSrikar R. Myeisha Kruser, MD, <Dictator> Orie FishermanSRIKAR R Nashly Olsson MD ELECTRONICALLY SIGNED 02/11/2013 1:01

## 2015-04-20 NOTE — Consult Note (Signed)
Chief Complaint:   Chief Complaint Pt is a 34 years old single white male, transferred from [place] after having a tonic clonic seizure while talking on the phone. He stated that he has h/o seizures since he was 34 years old. Psychiatric consult was placed as pt was expressing suicidal ideations to one of the nursing staff.   Presenting Symptoms:   Presenting Symptoms Suicidal Ideation  Substance Use   History of Present Illness:   History of Present Illness The patient is a 34 year old white male who reports that he was diagnosed with a seizure as a child. He is non complaint with his medications and started having recurrent seizures as he was taking dilantin as needed. He stated  feels funny, feels like he is sitting in a boat. Otherwise denies any headaches, no neck stiffness. Denies any fevers, chills. He did have recent episode of cough and started taking some cough syrup today prior to going to prison.  During my interview, he was lying in bed. He reported that he was recently evaluated by the NP at [facility] for his Bipolar disorder and she was trying him on different medications to control his mood symptoms. He does not remember the names of the pills. He stated that he gets agitated quickly, will loose temper, wants to tear off the curtain and and will fly off the handle. He wishes that everything  will end quickly, which he described that one of the seizures will end it all. However, he does not have any active plans. He denied feeling depressed, and stated that he wants to go back with his 2 daughter and 62 month old son and his girl friend. He is looking forward to his father to bail him out as his charges are very minor.  He denied any perceptual disturbances. No homicidal ideations or plans noted.  He stated that he took his prescriptions to the [facility]. I called that pharmacy to obtain collateral information. Pt was given Clonidine 0.1mg  #90 pills on 11/28/12 and also has a prescription of  #120 pills on hold. They were all prescribed by Lennox Solders, NP.  He was prescribed Lamotrigine  po Bid on 12/ 26/ 13 which he did not pick  up. Also obtained information from [facility] in [place]. Pt has a prescription of Depakote  BID from 12/16/12 which is still pending.  He remains non compliant with his medications. Denied SI/HI or plans.   Target Symptoms:   Depressive Depressed Mood    Capacity Recognizes the presence of illlness  Understands consequences of treatment refusal  Understands risks, benefits, alternatives  Exhibits acceptable reasoning/judgment  Communicates clear choices  Can care for self independently   Past Psychiatric Treatment: First Treatment: h/o Bipolar Disorder.   Previous Hospitalizations: Denied.   History of Suicide Attempts: Denied.   Current Outpatient Treatment: [facility].   Substance Abuse Treatment History: Uses [facility] on weekend. UDS positive.   The patient reports a history of non-compliance with prescribed medication regimen..  Substance Abuse- Alcohol: The patient denies any use of alcohol..  Substance Abuse- Cocaine: The patient does report a history of IV cocaine use..  Substance Abuse- Opiates: The patient does report a history of IV opiate use..  Substance Abuse- Cannabis: The patient currently uses cannabis on a regular basis.Marland Kitchen  PAST MEDICAL & SURGICAL HX:  Significant Events:   seizures:    nerve disorder: pt still in process of being dx. appt with neuorologist in Jan.   Seizures:  Arm Surgery - Left:   CURRENT OUTPATIENT MEDICATIONS:  Home Medications: Medication Instructions Status  Dilantin 1 tab(s) orally , As Needed for seizures Active   Family History: The patient denies any history of mental illness in the family..  Social History: Lives with GF and 3 children. Family is supportive.  History of Trauma or Abuse: The patient denies any history of previous physical or sexual abuse..  Legal  History: Currently has legal issues.  Mental Status Exam:   Mental Status Exam Thinly built male lying in bed.    Speech Fluent    Mood Depressed    Affect Congruent    Thought Processes Linear/logical/goal directed    Orientation Place  Time  Person    Concentration Fair    Fund of Knowledge Fair    Language Fair    Judgement Fair    Insight Fair    Reliabiity Fair   Suicide Risk Assessment: Suicide Risk Level No risk inidicated.  Assessment & Diagnosis: Axis I: Bipolar DO NOS Cannabis Abuse.   Axis II: none.   Axis III: Seizure.   Axis IV: Problems with legal system .  Treatment Plan: Patient is aware of and understands the risks and benefits of the proposed treatment or treatment changes..   Discussed with pt about the treatment risks, benefits and alternative. He contracted for safety.  Continue Depakote ER 500mg  po BID Add Risperdal 0.5 for mood stabilization.  Add Cogentin 0.5mg  po qhs Pt can be discharged when clinically stable   Thank you for allowing me to participate in care of this pt.  Electronic Signatures: Rhunette CroftFaheem, Janaisha Tolsma S (MD)  (Signed 04-Feb-14 13:17)  Authored: Chief Complaint, Presenting Symptoms, History of Present Illness, Target Symptoms, Past Psychiatric Treatment, Substance Abuse History, PAST MEDICAL & SURGICAL HX, CURRENT OUTPATIENT MEDICATIONS, Family History, Social History, History of Trauma or Abuse, Legal History, Mental Status Exam, Suicide Risk Assessment, Assessment & Diagnosis, Treatment Plan   Last Updated: 04-Feb-14 13:17 by Rhunette CroftFaheem, Ardis Lawley S (MD)

## 2015-04-20 NOTE — Consult Note (Signed)
PATIENT NAME:  Ian Bowers, Ian Bowers MR#:  [id number] DATE OF BIRTH:  1981-07-05  DATE OF CONSULTATION:  02/03/2013  REFERRING PHYSICIAN:   CONSULTING PHYSICIAN:  Pauletta BrownsYuriy Tyrea Froberg, MD  HISTORY OF PRESENT ILLNESS: This is a 34 year old male with diagnosis of pseudoseizures who was taking Dilantin p.r.n. but now started on valproic acid for mood stabilization. The patient has had multiple seizures since yesterday and at time of discharge the patient had another seizure episode. The patient is being discharged to prison. Suspect seizures are nonepileptic and are pseudoseizures.   PHYSICAL EXAMINATION: On neurological evaluation, the patient is alert, awake and oriented to time, place and location. He tells me the President [place]. On cranial nerve examination, extraocular ocular movements are intact. Visual fields are intact. Facial sensation and facial motor is intact. Palate elevates symmetrically. Tongue is midline. Shoulder shrug is intact bilaterally. Hearing is intact. Motor strength appears 5 out of 5 in bilateral upper and lower extremities. Coordination is intact. Sensation is intact to light touch and pain.  Gait not assessed.   IMPRESSION: A 34 year old with suspected pseudoseizures. EEG did not show any epileptiform activity. As the patient will be going back to prison, he is having multiple episodes of what is suspected to be pseudoseizures. At this point, when discussing with the primary team, it is difficult to discharge the patient as when the patient is discharged, even with the diagnosis of pseudoseizures to be made, they will send him back to the hospital. The case was discussed with the EEG tech yesterday. The plan is to obtain EEG monitoring with seizure provocation and that would be explained to the patient that we would do EEG and tell him that he might have a seizure during the episode to see if there is a focus on it. If the patient has a seizure with no             changes on EEG showing epileptiform activity, we can confirm the diagnosis of pseudoseizures and then possibly try to discharge him at that time.   Thank you, it was a pleasure seeing this patient.  ____________________________ Pauletta BrownsYuriy Wah Sabic, MD yz:sb D: 02/03/2013 11:01:52 ET T: 02/03/2013 11:22:30 ET JOB#: [id number]  cc: Pauletta BrownsYuriy Kahlia Lagunes, MD, <Dictator> Pauletta BrownsYURIY Decklan Mau MD ELECTRONICALLY SIGNED 02/06/2013 18:12

## 2015-04-20 NOTE — Consult Note (Signed)
PATIENT NAME:  Ian Bowers, Ian Bowers MR#:  [id number] DATE OF BIRTH:  02-19-1981  DATE OF CONSULTATION:  02/02/2013  CONSULTING PHYSICIAN:  Pauletta BrownsYuriy Amneet Cendejas, MD  BRIEF SUMMARY: The patient is a 34 year old male who reported having seizures in childhood, and then restarted at the age of 89, comes here from prison with multiple generalized tonic-clonic seizures. In the past, he was taking Dilantin p.r.n. upwards of 800 to 1200 mg a day. He was diagnosed with pseudoseizures as they were nonepileptiform activities in the past; and currently since he is in prison he was taken to the hospital because of more of these episodes that were suspicious for convulsive seizures. Today he was supposed to be discharged and upon discharge he had another episode of generalized tonic-clonic seizure lasting 2 to 3 minutes, and apparently he was not postictal post that event. The EEG was reviewed by Dr. Malvin JohnsPotter, and it was a normal awake EEG, no slowing and no seizure-like activity.   NEUROLOGICAL EXAMINATION:  GENERAL: The patient is alert, awake, oriented to time, place and location and tells me the President [place] and the reason why is here.  CRANIAL NERVE EXAMINATION: Extraocular movements are intact. Motor strength is 5/5 bilateral upper and lower extremities. Sensation is intact to light touch. Coordination, finger-to-nose intact. Gait could not be assessed.   IMPRESSION AND PLAN: The patient is a 34 year old male with suspected pseudoseizures. EEG did not show any epileptiform activity. As the patient would be going back to prison, it is difficult to discharge him while he is having these suspected pseudoseizures. The patient's treatment has been changed to Depakote for a history of bipolar disorder. At this point, I do not feel safe to discharge to prison because he probably will come right back with the suspected seizure activity. We will continue his Depakote. I spoke to the EEG tech; we will repeat EEG in  the morning and possibly do seizure provocation.  Hopefully we can get some nonepileptic activity during one of these episodes to confirm that these are pseudoseizures. His seizures are also not consistent with  real seizures as after he has 2 to 4 minutes of generalized tonic-clonic seizure there has to be a postictal state because of general neuron firing and for them to be in       refractory status, but apparently he does not have a postictal state. I would consider consultation to psychiatry for conversion disorder.   Thank you.  It was a pleasure seeing this patient again.   ____________________________ Pauletta BrownsYuriy Corie Vavra, MD yz:cb D: 02/02/2013 12:45:24 ET T: 02/02/2013 13:07:35 ET JOB#: [id number]  cc: Pauletta BrownsYuriy Arael Piccione, MD, <Dictator> Pauletta BrownsYURIY Elizabeht Suto MD ELECTRONICALLY SIGNED 02/06/2013 18:11

## 2016-03-24 ENCOUNTER — Emergency Department
Admission: EM | Admit: 2016-03-24 | Discharge: 2016-03-24 | Disposition: A | Discharge status: Home / Self Care | Payer: Self-pay | Attending: Emergency Medicine | Admitting: Emergency Medicine

## 2016-03-24 ENCOUNTER — Encounter: Payer: Self-pay | Admitting: Emergency Medicine

## 2016-03-24 ENCOUNTER — Emergency Department: Payer: Self-pay

## 2016-03-24 DIAGNOSIS — M545 Low back pain: Secondary | ICD-10-CM | POA: Insufficient documentation

## 2016-03-24 DIAGNOSIS — F17200 Nicotine dependence, unspecified, uncomplicated: Secondary | ICD-10-CM | POA: Insufficient documentation

## 2016-03-24 DIAGNOSIS — M549 Dorsalgia, unspecified: Secondary | ICD-10-CM

## 2016-03-24 DIAGNOSIS — F1290 Cannabis use, unspecified, uncomplicated: Secondary | ICD-10-CM | POA: Insufficient documentation

## 2016-03-24 DIAGNOSIS — R569 Unspecified convulsions: Secondary | ICD-10-CM | POA: Insufficient documentation

## 2016-03-24 HISTORY — DX: Unspecified convulsions: R56.9

## 2016-03-24 HISTORY — DX: Dorsalgia, unspecified: M54.9

## 2016-03-24 HISTORY — DX: Other chronic pain: G89.29

## 2016-03-24 MED ORDER — NAPROXEN 500 MG PO TABS: 500.0000 mg | ORAL_TABLET | Freq: Two times a day (BID) | ORAL | Status: AC

## 2016-03-24 MED ORDER — DIAZEPAM 5 MG/ML IJ SOLN
5.0000 mg | Freq: Once | INTRAMUSCULAR | Status: AC
  Administered 2016-03-24: 5 mg via INTRAVENOUS
  Filled 2016-03-24: qty 2

## 2016-03-24 MED ORDER — HYDROMORPHONE HCL 1 MG/ML IJ SOLN
1.0000 mg | Freq: Once | INTRAMUSCULAR | Status: AC
  Administered 2016-03-24: 1 mg via INTRAVENOUS
  Filled 2016-03-24: qty 1

## 2016-03-24 MED ORDER — BACLOFEN 10 MG PO TABS: 10.0000 mg | ORAL_TABLET | Freq: Three times a day (TID) | ORAL | Status: AC

## 2016-03-24 NOTE — Discharge Instructions (Signed)
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
## 2016-03-24 NOTE — ED Provider Notes (Signed)
[facility] Emergency Department Provider Note  ____________________________________________  Time seen: Approximately 2:56 PM  I have reviewed the triage vital signs and the nursing notes.   HISTORY  Chief Complaint Back Pain    HPI Ian B Rhian Funari. is a 35 y.o. male who was driving down the road and presents with sudden onset of severe back pain. States pain started in his lumbar spine radiating up to his neck and down both legs. Patient reports: A chiropractor approximately 3 days ago for the same. Scheduled for an MRI but has not had. HIS pain as a 10 over 10. Patient reports falling approximately 6 feet yesterday. Denies any cauda equina symptoms or paresthesia and numbness in the groin    Past Medical History  Diagnosis Date  . Chronic back pain   . Seizures (HCC)     There are no active problems to display for this patient.   History reviewed. No pertinent past surgical history.  No current outpatient prescriptions on file.  Allergies Penicillins  History reviewed. No pertinent family history.  Social History Social History  Substance Use Topics  . Smoking status: Current Every Day Smoker  . Smokeless tobacco: None  . Alcohol Use: No    Review of Systems Constitutional: No fever/chills Eyes: No visual changes. ENT: No sore throat. Cardiovascular: Denies chest pain. Respiratory: Denies shortness of breath. Gastrointestinal: No abdominal pain.  No nausea, no vomiting.  No diarrhea.  No constipation. Genitourinary: Negative for dysuria. Musculoskeletal: Positive for low back pain Skin: Negative for rash. Neurological: Negative for headaches, focal weakness or numbness.  10-point ROS otherwise negative.  ____________________________________________   PHYSICAL EXAM:  VITAL SIGNS: ED Triage Vitals  Enc Vitals Group     BP 03/24/16 1310 132/88 mmHg     Pulse Rate 03/24/16 1310 80     Resp 03/24/16 1310 20     Temp  03/24/16 1310 98.2 F (36.8 C)     Temp Source 03/24/16 1310 Oral     SpO2 03/24/16 1310 97 %     Weight 03/24/16 1310 198 lb (89.812 kg)     Height 03/24/16 1310  (1.803 m)     Head Cir --      Peak Flow --      Pain Score 03/24/16 1310 7     Pain Loc --      Pain Edu? --      Excl. in GC? --     Constitutional: Alert and oriented. Well appearing and in no acute distress. Cardiovascular: Normal rate, regular rhythm. Grossly normal heart sounds.  Good peripheral circulation. Respiratory: Normal respiratory effort.  No retractions. Lungs CTAB. Musculoskeletal: No lower extremity tenderness nor edema.  No joint effusions. Leg raise positive bilaterally at about 20-30. Neurologic:  Normal speech and language. No gross focal neurologic deficits are appreciated. No gait instability. Skin:  Skin is warm, dry and intact. No rash noted. Psychiatric: Mood and affect are normal. Speech and behavior are normal.  ____________________________________________   LABS (all labs ordered are listed, but only abnormal results are displayed)  Labs Reviewed - No data to display ____________________________________________    RADIOLOGY  Negative for Acute osseous findings. ____________________________________________   PROCEDURES  Procedure(s) performed: None  Critical Care performed: No  ____________________________________________   INITIAL IMPRESSION / ASSESSMENT AND PLAN / ED COURSE  Pertinent labs & imaging results that were available during my care of the patient were reviewed by me and considered in my medical  decision making (see chart for details).  Patient reports significant improvement after medication. Discharged home with Rx for baclofen and Naprosyn. Patient voices no other emergency medical complaints and will follow-up with his PCP as needed. ____________________________________________   FINAL CLINICAL IMPRESSION(S) / ED DIAGNOSES  Final diagnoses:  None      This chart was dictated using voice recognition software/Dragon. Despite best efforts to proofread, errors can occur which can change the meaning. Any change was purely unintentional.   Evangeline Dakinharles M Mikias Lanz, PA-C 03/24/16 1743  Gayla DossEryka A Gayle, MD 03/25/16 2134

## 2016-03-24 NOTE — ED Notes (Signed)
See triage note. Hx of back pain for several years .Marland Kitchen. Pain is worse over the past several days  Unable to bear full wt

## 2017-05-26 ENCOUNTER — Emergency Department

## 2017-05-26 ENCOUNTER — Emergency Department
Admission: EM | Admit: 2017-05-26 | Discharge: 2017-05-26 | Disposition: A | Discharge status: Home / Self Care | Attending: Emergency Medicine | Admitting: Emergency Medicine

## 2017-05-26 ENCOUNTER — Encounter: Payer: Self-pay | Admitting: Medical Oncology

## 2017-05-26 DIAGNOSIS — F17200 Nicotine dependence, unspecified, uncomplicated: Secondary | ICD-10-CM | POA: Diagnosis not present

## 2017-05-26 DIAGNOSIS — G40909 Epilepsy, unspecified, not intractable, without status epilepticus: Secondary | ICD-10-CM | POA: Diagnosis not present

## 2017-05-26 DIAGNOSIS — R569 Unspecified convulsions: Secondary | ICD-10-CM

## 2017-05-26 LAB — CBC WITH DIFFERENTIAL/PLATELET
BASOS ABS: 0.1 10*3/uL (ref 0–0.1)
Basophils Relative: 1 %
Eosinophils Absolute: 0.1 10*3/uL (ref 0–0.7)
Eosinophils Relative: 1 %
HEMATOCRIT: 45.8 % (ref 40.0–52.0)
Hemoglobin: 15.4 g/dL (ref 13.0–18.0)
LYMPHS ABS: 2 10*3/uL (ref 1.0–3.6)
LYMPHS PCT: 24 %
MCH: 27.3 pg (ref 26.0–34.0)
MCHC: 33.7 g/dL (ref 32.0–36.0)
MCV: 81.1 fL (ref 80.0–100.0)
MONO ABS: 0.5 10*3/uL (ref 0.2–1.0)
Monocytes Relative: 5 %
NEUTROS ABS: 5.8 10*3/uL (ref 1.4–6.5)
Neutrophils Relative %: 69 %
Platelets: 305 10*3/uL (ref 150–440)
RBC: 5.65 MIL/uL (ref 4.40–5.90)
RDW: 13.4 % (ref 11.5–14.5)
WBC: 8.4 10*3/uL (ref 3.8–10.6)

## 2017-05-26 LAB — COMPREHENSIVE METABOLIC PANEL
ALT: 18 U/L (ref 17–63)
AST: 25 U/L (ref 15–41)
Albumin: 4.6 g/dL (ref 3.5–5.0)
Alkaline Phosphatase: 66 U/L (ref 38–126)
Anion gap: 11 (ref 5–15)
BILIRUBIN TOTAL: 0.7 mg/dL (ref 0.3–1.2)
BUN: 14 mg/dL (ref 6–20)
CO2: 22 mmol/L (ref 22–32)
CREATININE: 1 mg/dL (ref 0.61–1.24)
Calcium: 9.5 mg/dL (ref 8.9–10.3)
Chloride: 106 mmol/L (ref 101–111)
GFR calc Af Amer: >60 mL/min (ref >60)
Glucose, Bld: 109 mg/dL — ABNORMAL HIGH (ref 65–99)
Potassium: 3.7 mmol/L (ref 3.5–5.1)
Sodium: 139 mmol/L (ref 135–145)
TOTAL PROTEIN: 8 g/dL (ref 6.5–8.1)

## 2017-05-26 MED ORDER — LORAZEPAM 2 MG/ML IJ SOLN
1.0000 mg | Freq: Once | INTRAMUSCULAR | Status: AC
  Administered 2017-05-26: 1 mg via INTRAVENOUS
  Filled 2017-05-26: qty 1
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
## 2017-07-27 ENCOUNTER — Encounter: Payer: Self-pay | Admitting: *Deleted

## 2017-07-27 ENCOUNTER — Emergency Department
Admission: EM | Admit: 2017-07-27 | Discharge: 2017-07-27 | Disposition: A | Discharge status: Home / Self Care | Attending: Emergency Medicine | Admitting: Emergency Medicine

## 2017-07-27 DIAGNOSIS — H9319 Tinnitus, unspecified ear: Secondary | ICD-10-CM | POA: Insufficient documentation

## 2017-07-27 DIAGNOSIS — R51 Headache: Secondary | ICD-10-CM | POA: Insufficient documentation

## 2017-07-27 DIAGNOSIS — Z5321 Procedure and treatment not carried out due to patient leaving prior to being seen by health care provider: Secondary | ICD-10-CM | POA: Insufficient documentation

## 2017-07-27 DIAGNOSIS — R1110 Vomiting, unspecified: Secondary | ICD-10-CM | POA: Insufficient documentation

## 2017-07-27 NOTE — ED Notes (Signed)
No answer when called from lobby x1 

## 2017-07-27 NOTE — ED Triage Notes (Addendum)
Pt to triage moaning with a rag at his mouth.  Pt in a wheelchair.  Pt reports a headache since 2000 today.  Pt reports vomiting, ringing ears.  Pt took tylenol with no relief    Hx  Possible seizures-no meds.  Pt alert  Speech clear.

## 2017-07-27 NOTE — ED Notes (Signed)
No answer when called from lobby 

## 2017-07-29 ENCOUNTER — Telehealth: Payer: Self-pay | Admitting: Emergency Medicine
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
## 2022-10-02 ENCOUNTER — Other Ambulatory Visit: Payer: Self-pay

## 2022-10-02 ENCOUNTER — Emergency Department
Admission: EM | Admit: 2022-10-02 | Discharge: 2022-10-03 | Disposition: A | Discharge status: Home / Self Care | Attending: Emergency Medicine | Admitting: Emergency Medicine

## 2022-10-02 ENCOUNTER — Emergency Department

## 2022-10-02 DIAGNOSIS — E876 Hypokalemia: Secondary | ICD-10-CM

## 2022-10-02 DIAGNOSIS — R569 Unspecified convulsions: Secondary | ICD-10-CM

## 2022-10-02 DIAGNOSIS — G40909 Epilepsy, unspecified, not intractable, without status epilepticus: Secondary | ICD-10-CM | POA: Insufficient documentation

## 2022-10-02 LAB — COMPREHENSIVE METABOLIC PANEL
ALT: 17 U/L (ref 0–44)
AST: 28 U/L (ref 15–41)
Albumin: 4.9 g/dL (ref 3.5–5.0)
Alkaline Phosphatase: 82 U/L (ref 38–126)
Anion gap: 16 — ABNORMAL HIGH (ref 5–15)
BUN: 23 mg/dL — ABNORMAL HIGH (ref 6–20)
CO2: 26 mmol/L (ref 22–32)
Calcium: 9.3 mg/dL (ref 8.9–10.3)
Chloride: 93 mmol/L — ABNORMAL LOW (ref 98–111)
Creatinine, Ser: 1.81 mg/dL — ABNORMAL HIGH (ref 0.61–1.24)
GFR, Estimated: 48 mL/min — ABNORMAL LOW (ref >60)
Glucose, Bld: 181 mg/dL — ABNORMAL HIGH (ref 70–99)
Potassium: 2.7 mmol/L — CL (ref 3.5–5.1)
Sodium: 135 mmol/L (ref 135–145)
Total Bilirubin: 0.6 mg/dL (ref 0.3–1.2)
Total Protein: 8.6 g/dL — ABNORMAL HIGH (ref 6.5–8.1)

## 2022-10-02 LAB — ETHANOL: Alcohol, Ethyl (B): <10 mg/dL (ref <10)

## 2022-10-02 LAB — CBC WITH DIFFERENTIAL/PLATELET
Abs Immature Granulocytes: 0.06 10*3/uL (ref 0.00–0.07)
Basophils Absolute: 0.1 10*3/uL (ref 0.0–0.1)
Basophils Relative: 1 %
Eosinophils Absolute: 0.1 10*3/uL (ref 0.0–0.5)
Eosinophils Relative: 0 %
HCT: 44.7 % (ref 39.0–52.0)
Hemoglobin: 14.2 g/dL (ref 13.0–17.0)
Immature Granulocytes: 0 %
Lymphocytes Relative: 19 %
Lymphs Abs: 2.6 10*3/uL (ref 0.7–4.0)
MCH: 25.9 pg — ABNORMAL LOW (ref 26.0–34.0)
MCHC: 31.8 g/dL (ref 30.0–36.0)
MCV: 81.6 fL (ref 80.0–100.0)
Monocytes Absolute: 0.9 10*3/uL (ref 0.1–1.0)
Monocytes Relative: 7 %
Neutro Abs: 9.9 10*3/uL — ABNORMAL HIGH (ref 1.7–7.7)
Neutrophils Relative %: 73 %
Platelets: 332 10*3/uL (ref 150–400)
RBC: 5.48 MIL/uL (ref 4.22–5.81)
RDW: 12.6 % (ref 11.5–15.5)
WBC: 13.5 10*3/uL — ABNORMAL HIGH (ref 4.0–10.5)
nRBC: 0 % (ref 0.0–0.2)

## 2022-10-02 MED ORDER — ACETAMINOPHEN 325 MG PO TABS
650.0000 mg | ORAL_TABLET | Freq: Once | ORAL | Status: AC
  Administered 2022-10-02: 650 mg via ORAL
  Filled 2022-10-02: qty 2

## 2022-10-02 MED ORDER — LORAZEPAM 2 MG/ML IJ SOLN
INTRAMUSCULAR | Status: AC
  Administered 2022-10-02: 1 mg via INTRAVENOUS
  Filled 2022-10-02: qty 1

## 2022-10-02 MED ORDER — POTASSIUM CHLORIDE 20 MEQ PO PACK
40.0000 meq | PACK | Freq: Two times a day (BID) | ORAL | Status: DC
  Administered 2022-10-03: 40 meq via ORAL
  Filled 2022-10-02: qty 2

## 2022-10-02 NOTE — ED Notes (Signed)
This RN now assuming care of pt. Report received. 

## 2022-10-02 NOTE — ED Notes (Signed)
SZ pads in place. Pt appears to be waking up intermittent  periods of starting episodes.

## 2022-10-02 NOTE — ED Notes (Signed)
PD at bedside. Pt c/o headache, MD notified and new orders placed.

## 2022-10-02 NOTE — ED Notes (Signed)
CT called to inform this RN that patient appeared to be having a seizure. This Rn arrived to CT to find patient shivering with their eyes closed. Pt not responding to verbal stimuli. Pt protecting airway at this time and resting in bed after ativan administration.

## 2022-10-02 NOTE — ED Triage Notes (Signed)
Pt coming ems from home in PD custody after haing a tonic clonic witnessed sz. Pt has PMH and unsure if taking meds and compliant. Pt was given 3mg  versed in L 20G AC IV. Pt appears to be in starting state/absence sz upon arrival.   134/96  14rr 121 pr 100% on NRB 15L Cbg 215

## 2022-10-02 NOTE — ED Provider Notes (Signed)
[facility] Provider Note    Event Date/Time   First MD Initiated Contact with Patient 10/02/22 2104     (approximate)   History   Seizures   HPI  Ian B Ettore Bowers. is a 41 y.o. male who was being served with a warrant by the sheriff.  Apparently his dogs got out and were running around 1 but his wife.  The Sheriff's deputies apparently had to shoot 1.  Patient was found in the house and then when he was being brought out to go to jail he began having a seizure.  Review of his old records shows that he had a seizure last time he went to jail.  Patient himself says he gets Dilantin.  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     Physical Exam   Triage Vital Signs: ED Triage Vitals  Enc Vitals Group     BP 10/02/22 2056 (!) 162/98     Pulse Rate 10/02/22 2056 (!) 133     Resp 10/02/22 2056 19     Temp 10/02/22 2056 99 F (37.2 C)     Temp src --      SpO2 10/02/22 2056 96 %     Weight 10/02/22 2057 200 lb (90.7 kg)     Height --      Head Circumference --      Peak Flow --      Pain Score --      Pain Loc --      Pain Edu? --      Excl. in [place]? --     Most recent vital signs: Vitals:   10/02/22 2200 10/02/22 2230  BP: 128/89 (!) 154/101  Pulse: (!) 109 (!) 103  Resp: 11 17  Temp:    SpO2: 95% 96%     General: Initially laying with eyes open.  Responded to first time to throughout but not the second time.  Later awake, no distress.  CV:  Good peripheral perfusion.  Regular rate and rhythm no audible murmurs Resp:  Normal effort.  Abd:  No distention.  Soft and nontender And CT he began twitching again.  His eyelids were fluttering rapidly when we opened his eyes and his eyes were looking left and immediately moved to the right to look at the person who opened the eyes.  They did not move after  that.  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   ED Results / Procedures / Treatments   Labs (all labs ordered are listed, but only abnormal results are displayed) Labs Reviewed  COMPREHENSIVE METABOLIC PANEL - Abnormal; Notable for the following components:      Result Value   Potassium 2.7 (*)    Chloride 93 (*)    Glucose, Bld 181 (*)    BUN 23 (*)    Creatinine, Ser 1.81 (*)    Total Protein 8.6 (*)    GFR, Estimated 48 (*)    Anion gap 16 (*)    All other components within normal limits  CBC WITH DIFFERENTIAL/PLATELET - Abnormal; Notable for the following components:   WBC 13.5 (*)    MCH 25.9 (*)    Neutro Abs 9.9 (*)    All other components within normal limits  ETHANOL  ACETAMINOPHEN LEVEL  SALICYLATE LEVEL  PHENYTOIN LEVEL, TOTAL  EKG  EKG read interpreted by me shows sinus tachycardia rate of 137 normal axis QT interval was 600 ms   RADIOLOGY Chest x-ray read by radiology reviewed and interpreted by me is negative for acute disease head CT is the same read by radiology reviewed and interpreted by me.   PROCEDURES:  Critical Care performed:   Procedures   MEDICATIONS ORDERED IN ED: Medications  potassium chloride (KLOR-CON) packet 40 mEq (40 mEq Oral Given 10/03/22 0006)  LORazepam (ATIVAN) 2 MG/ML injection (1 mg Intravenous Given 10/02/22 2141)  acetaminophen (TYLENOL) tablet 650 mg (650 mg Oral Given 10/02/22 2338)     IMPRESSION / MDM / ASSESSMENT AND PLAN / ED COURSE  I reviewed the triage vital signs and the nursing notes. Patient still has some things pending.  I am signing the patient out to oncoming provider.  Differential diagnosis includes, but is not limited to, seizures or pseudoseizures  Patient's presentation is most consistent with exacerbation of chronic illness.  The patient is on the cardiac monitor to evaluate for evidence of arrhythmia and/or significant heart rate changes.  None  were seen   FINAL CLINICAL IMPRESSION(S) / ED DIAGNOSES   Final diagnoses:  Seizure-like activity (HCC)     Rx / DC Orders   ED Discharge Orders     None        Note:  This document was prepared using Dragon voice recognition software and may include unintentional dictation errors.   Arnaldo Natal, MD 10/03/22 [phone]

## 2022-10-03 LAB — SALICYLATE LEVEL: Salicylate Lvl: <7 mg/dL — ABNORMAL LOW (ref 7.0–30.0)

## 2022-10-03 LAB — ACETAMINOPHEN LEVEL: Acetaminophen (Tylenol), Serum: <10 ug/mL — ABNORMAL LOW (ref 10–30)

## 2022-10-03 LAB — PHENYTOIN LEVEL, TOTAL: Phenytoin Lvl: <2.5 ug/mL — ABNORMAL LOW (ref 10.0–20.0)

## 2022-10-03 MED ORDER — SODIUM CHLORIDE 0.9 % IV SOLN
1000.0000 mg | Freq: Once | INTRAVENOUS | Status: AC
  Administered 2022-10-03: 1000 mg via INTRAVENOUS
  Filled 2022-10-03: qty 20

## 2022-10-03 NOTE — ED Provider Notes (Signed)
-----------------------------------------   12:49 AM on 10/03/2022 -----------------------------------------   IV Cerebyx aiming for subtherapeutic Dilantin level.  Patient took oral potassium replacement.  No seizure activity while in the emergency department.  He will be discharged to jail after completion of IV Cerebyx.  Strict return precautions given.  Patient verbalized understanding and agrees with plan of care.   Paulette Blanch, MD 10/03/22 [phone]

## 2022-10-03 NOTE — Discharge Instructions (Signed)
Take your medicine as directed by your doctor.  Return to the ER for worsening symptoms, persistent vomiting, lethargy or other concerns.

## 2022-10-03 NOTE — ED Notes (Signed)
Pt verbalized understanding of DC instructions. Pt left in [employer] custody.
